# Patient Record
Sex: Male | Born: 1935 | Race: White | Hispanic: No | Marital: Married | Smoking: Never smoker
Health system: Southern US, Community
[De-identification: ages and names within clinical notes are randomized; demographics above are authoritative.]

## PROBLEM LIST (undated history)

## (undated) DIAGNOSIS — F419 Anxiety disorder, unspecified: Secondary | ICD-10-CM

## (undated) DIAGNOSIS — I499 Cardiac arrhythmia, unspecified: Secondary | ICD-10-CM

## (undated) DIAGNOSIS — J8410 Pulmonary fibrosis, unspecified: Secondary | ICD-10-CM

## (undated) HISTORY — PX: EYE SURGERY: SHX253

## (undated) HISTORY — PX: HERNIA REPAIR: SHX51

---

## 2018-01-19 ENCOUNTER — Institutional Professional Consult (permissible substitution): Payer: Self-pay | Admitting: Pulmonary Disease

## 2018-01-26 ENCOUNTER — Ambulatory Visit
Admission: RE | Admit: 2018-01-26 | Discharge: 2018-01-26 | Disposition: A | Discharge status: Home / Self Care | Payer: Self-pay | Source: Ambulatory Visit | Attending: Internal Medicine | Admitting: Internal Medicine

## 2018-01-26 ENCOUNTER — Telehealth: Payer: Self-pay | Admitting: Internal Medicine

## 2018-01-26 ENCOUNTER — Other Ambulatory Visit: Payer: Self-pay | Admitting: *Deleted

## 2018-01-26 ENCOUNTER — Encounter: Payer: Self-pay | Admitting: Internal Medicine

## 2018-01-26 ENCOUNTER — Ambulatory Visit (INDEPENDENT_AMBULATORY_CARE_PROVIDER_SITE_OTHER): Payer: Medicare Other | Admitting: Internal Medicine

## 2018-01-26 ENCOUNTER — Other Ambulatory Visit (INDEPENDENT_AMBULATORY_CARE_PROVIDER_SITE_OTHER): Payer: Medicare Other

## 2018-01-26 VITALS — BP 122/70 | HR 90 | Ht 74.0 in | Wt 217.0 lb

## 2018-01-26 DIAGNOSIS — J849 Interstitial pulmonary disease, unspecified: Secondary | ICD-10-CM

## 2018-01-26 LAB — SEDIMENTATION RATE: Sed Rate: 63 mm/hr — ABNORMAL HIGH (ref 0–20)

## 2018-01-26 NOTE — Telephone Encounter (Signed)
Pt is scheduled to come back to see MR Tues 5/7 at 4pm.  There was an opening at 3pm for pft to be done.  Have scheduled pt to have PFT at 3pm on 5/7. Attempted to call pt to let him know this information and that we would need him to arrive at 3pm instead of 4pm so he could have the PFT done, but pt did not answer.  Left a detailed message for pt stating all the above information and stated in the message for pt to return call so we know he has received the information.  Placed the order for the pft.

## 2018-01-26 NOTE — Patient Instructions (Addendum)
ICD-10-CM   1. ILD (interstitial lung disease) ([facility]) J84.9      -You have Interstitial Lung Disease (ILD)  -  There are > 100 varieties of this - To narrow down possibilities and assess severity please do the following tests  - Serum: ESR, ACE, ANA, DS-DNA, RF, anti-CCP, ssA, ssB, scl-70, ANCA screen, MPO, PR-3, Total CK,  RNP, Aldolase,  Hypersensitivity Pneumonitis Panel   - do  ILD questionnaire  - do Pre-bd spiro and dlco only. No lung volume or bd response. No post-bd spiro next few weks  Followup  - next 1-2 weeks with DR Chase Caller - any slot - prefer 30 minutes in ILD clinic (can consider double book in ILD clinic )

## 2018-01-26 NOTE — Telephone Encounter (Signed)
Pt returned call. Pt confirmed that he is able to make his apt for PFT on 5/7 at 3pm.

## 2018-01-26 NOTE — Progress Notes (Signed)
Subjective:    Patient ID: Omar Mitchell, male    DOB: 12-03-1935, 82 y.o.   MRN: [id number]  PCP Glenda Chroman, MD   HPI   IOV 01/26/2018  Chief Complaint  Patient presents with  . Consult    Referred by Dr. Woody Seller for pulmonary fibrosis.  Pt states he was diagnosed with pulmonary fibrosis in October 2018. Has complaints of cough, some SOB that happens at any time but is worse with exertion.    Referred for evaluation of pulmonary fibrosis to the interstitial lung disease center.  There is a new consult.  He presents with a friend who is well versed with pulmonary fibrosis.  He also presents with his newly married wife.  History is obtained through the pulmonary integrated interstitial lung disease questionnaire  Symptoms: He first developed a cough in November/December 2018.  Cough is present at night it is a dry cough.  Since the cough started it is gotten better.  He does not have any wheezing.  He does have some mild shortness of breath the last 4 to 6 months.  Since it started it is getting better and started somewhat suddenly but he does not room by the conditions in which it started.  He has mild/moderate shortness of breath while walking on a level ground at his own pace and walking on level with others his age.  He has moderate to severe shortness of breath walking up a hill or walking upstairs but he does not have any shortness of breath while eating or standing up from a chair but he does get mild shortness of breath for showering dressing or shopping or sexual activities.  Review of systems: He has some fatigue for the last 6 months he also has arthralgia for the last several years.  He has lost 20 pounds of weight intentionally.  He does not have any snoring or rash or ulcers  Past medical history: Denies any asthma or COPD or heart failure rheumatoid arthritis or scleroderma or lupus or polymyositis or Sjogren's or acid reflux or sleep apnea or pulmonary hypertension or diabetes  or thyroid disease or stroke or seizures or hepatitis or tuberculosis or kidney disease or pneumonia or blood clots or heart disease or pleurisy  Family history of lung disease: Positive for asthma in uncle but no pulmonary fibrosis  Exposure history: Never smoked any cigarettes.  Never smoked any pipes.  He is lived [place] home [place] for the last [place] years.  The home itself is 80 years old.  Occupational history he worked for [employer] for 29 years and retired in 1991.  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  During this time he worked in [employer] floor 5 days a week did not wear a mask.  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  There is significant amount of heart murmur dust that he was exposed to chemical fumes he done some plumbing.  Otherwise he denies other kind of occupational work.  The environment was indeed dusty  Pulmonary toxicity history is taken some prednisone in the past but otherwise pulmonary toxicity history is negative   HRCT 07/05/17 - personally visualized - Read by thoracic rad DrPoff - done [facility] - defnite UIP per Dr Polly Cobia. I concur. There is Co artery calcification and dilated PA    Simple office walk 185 feet x  3 laps goal with forehead probe 01/26/2018   O2 used Room [employer]  Number laps completed All 3 laps  Comments about pace Slow pace  Resting Pulse Ox/HR 98%/105  Final Pulse Ox/HR 92/120  Desaturated </= 88% no  Desaturated <= 3% points yes  Got Tachycardic >/= 90/min yes  Symptoms at end of test Mild dyspnea         PFT data 08/23/17 outside  Fev1 2/35/60%  FVC 2.88/60%  Ratio 82  fef 25-75%   TLC 4.8/61  DLCO 18.26/48%      has no past medical history on file.   has no tobacco history on file.   Allergies  Allergen Reactions  . Neosporin [Neomycin-Polymyxin-Gramicidin] Other (See Comments)    Blisters     Immunization History  Administered Date(s) Administered  .  Influenza, High Dose Seasonal PF 05/29/2017    No family history on file.   Current Outpatient Medications:  .  fexofenadine (ALLEGRA) 180 MG tablet, Take 180 mg by mouth daily., Disp: , Rfl:  .  fluticasone (FLONASE) 50 MCG/ACT nasal spray, Place 2 sprays into both nostrils daily., Disp: , Rfl:  .  VENTOLIN HFA 108 (90 Base) MCG/ACT inhaler, INHALE ONE PUFF INTO THE LUNGS FOUR TIMES DAILY AS NEEDED, Disp: , Rfl: 1    Review of Systems  Constitutional: Positive for unexpected weight change. Negative for fever.  HENT: Positive for congestion and sinus pressure. Negative for dental problem, ear pain, nosebleeds, postnasal drip, rhinorrhea, sneezing, sore throat and trouble swallowing.   Eyes: Negative for redness and itching.  Respiratory: Positive for cough and shortness of breath. Negative for chest tightness and wheezing.   Cardiovascular: Negative for palpitations and leg swelling.  Gastrointestinal: Negative for nausea and vomiting.  Genitourinary: Negative for dysuria.  Musculoskeletal: Positive for joint swelling.  Skin: Negative for rash.  Allergic/Immunologic: Positive for environmental allergies. Negative for food allergies and immunocompromised state.  Neurological: Negative for headaches.  Hematological: Does not bruise/bleed easily.  Psychiatric/Behavioral: Positive for dysphoric mood. The patient is nervous/anxious.        Objective:   Physical Exam  Constitutional: He is oriented to person, place, and time. He appears well-developed and well-nourished. No distress.  HENT:  Head: Normocephalic and atraumatic.  Right Ear: External ear normal.  Left Ear: External ear normal.  Mouth/Throat: Oropharynx is clear and moist. No oropharyngeal exudate.  Eyes: Pupils are equal, round, and reactive to light. Conjunctivae and EOM are normal. Right eye exhibits no discharge. Left eye exhibits no discharge. No scleral icterus.  Neck: Normal range of motion. Neck supple. No JVD  present. No tracheal deviation present. No thyromegaly present.  Cardiovascular: Normal rate, regular rhythm and intact distal pulses. Exam reveals no gallop and no friction rub.  No murmur heard. Pulmonary/Chest: Effort normal. No respiratory distress. He has no wheezes. He has rales. He exhibits no tenderness.  bibalteral bibasal velcro crackes Also front  Abdominal: Soft. Bowel sounds are normal. He exhibits no distension and no mass. There is no tenderness. There is no rebound and no guarding.  Musculoskeletal: Normal range of motion. He exhibits no edema or tenderness.  Clubbing +  Lymphadenopathy:    He has no cervical adenopathy.  Neurological: He is alert and oriented to person, place, and time. He has normal reflexes. No cranial nerve deficit. Coordination normal.  Skin: Skin is warm and dry. No rash noted. He is not diaphoretic. No erythema. No pallor.  Psychiatric: He has a normal mood and affect. His behavior is normal. Judgment and thought content normal.  Nursing note and  vitals reviewed.  Vitals:   01/26/18 1140  BP: 122/70  Pulse: 90  SpO2: 94%  Weight: 217 lb (98.4 kg)  Height: 6' 2"  (1.88 m)    ,Estimated body mass index is 27.86 kg/m as calculated from the following:   Height as of this encounter: 6' 2"  (1.88 m).   Weight as of this encounter: 217 lb (98.4 kg).        Assessment & Plan:     ICD-10-CM   1. ILD (interstitial lung disease) ([facility]) J84.9 Sed Rate (ESR)    Angiotensin converting enzyme    Antinuclear Antib (ANA)    Anti-DNA antibody, double-stranded    Rheumatoid Factor    Cyclic citrul peptide antibody, IgG    Sjogren's syndrome antibods(ssa + ssb)    Anti-scleroderma antibody    ANCA Screen Reflex Titer    Mpo/pr-3 (anca) antibodies    CK Total (and CKMB)    Aldolase    RNP Antibodies    Hypersensitivity pnuemonitis profile       -You have Interstitial Lung Disease (ILD)  -  There are > 100 varieties of this - To narrow down  possibilities and assess severity please do the following tests  - Serum: ESR, ACE, ANA, DS-DNA, RF, anti-CCP, ssA, ssB, scl-70, ANCA screen, MPO, PR-3, Total CK,  RNP, Aldolase,  Hypersensitivity Pneumonitis Panel   - do [facility] ILD questionnaire  - do Pre-bd spiro and dlco only. No lung volume or bd response. No post-bd spiro next few weks  Followup  - next 1-2 weeks with DR Chase Caller - any slot - prefer 30 minutes in ILD clinic (can consider double book in ILD clinic )    Dr. Brand Males, M.D., [facility].C.P Pulmonary and Critical Care Medicine Staff Physician, [place] Director - Interstitial Lung Disease  Program  Pulmonary [place] at [place], [place], [address]  Pager: [phone], If no answer or between  15:00h - 7:00h: call [phone] Telephone: [phone]

## 2018-01-26 NOTE — Telephone Encounter (Signed)
Omar Mitchell needs Pre-bd spiro and dlco only. No lung volume or bd response. No post-bd spiro\ befoe he sees me. Can be here or [facility]

## 2018-01-26 NOTE — Telephone Encounter (Signed)
Will close encounter

## 2018-01-27 LAB — CK TOTAL AND CKMB (NOT AT ARMC)
CK TOTAL: 55 U/L (ref 44–196)
CK, MB: 1.1 ng/mL (ref 0–5.0)
Relative Index: 2 (ref 0–4.0)

## 2018-01-27 LAB — ANTI-DNA ANTIBODY, DOUBLE-STRANDED: ds DNA Ab: <1 IU/mL

## 2018-01-27 LAB — MPO/PR-3 (ANCA) ANTIBODIES: Myeloperoxidase Abs: <1 AI

## 2018-01-27 LAB — CYCLIC CITRUL PEPTIDE ANTIBODY, IGG: Cyclic Citrullin Peptide Ab: <16 UNITS

## 2018-01-27 LAB — ANGIOTENSIN CONVERTING ENZYME: ANGIOTENSIN-CONVERTING ENZYME: 45 U/L (ref 9–67)

## 2018-01-27 LAB — SJOGREN'S SYNDROME ANTIBODS(SSA + SSB)
SSA (Ro) (ENA) Antibody, IgG: <1 AI
SSB (La) (ENA) Antibody, IgG: <1 AI

## 2018-01-27 LAB — RNP ANTIBODIES

## 2018-01-27 LAB — RHEUMATOID FACTOR

## 2018-01-27 LAB — ANTI-SCLERODERMA ANTIBODY: Scleroderma (Scl-70) (ENA) Antibody, IgG: <1 AI

## 2018-01-27 LAB — ANCA SCREEN W REFLEX TITER: ANCA Screen: NEGATIVE

## 2018-01-28 LAB — ALDOLASE: ALDOLASE: 5.3 U/L (ref <=8.1)

## 2018-01-28 LAB — ANA: Anti Nuclear Antibody(ANA): NEGATIVE

## 2018-01-31 LAB — HYPERSENSITIVITY PNUEMONITIS PROFILE
ASPERGILLUS FUMIGATUS: NEGATIVE
FAENIA RETIVIRGULA: NEGATIVE
Pigeon Serum: NEGATIVE
S. VIRIDIS: NEGATIVE
T. CANDIDUS: NEGATIVE
T. VULGARIS: NEGATIVE

## 2018-02-01 ENCOUNTER — Ambulatory Visit (INDEPENDENT_AMBULATORY_CARE_PROVIDER_SITE_OTHER): Payer: Medicare Other | Admitting: Internal Medicine

## 2018-02-01 ENCOUNTER — Encounter: Payer: Self-pay | Admitting: Internal Medicine

## 2018-02-01 VITALS — BP 122/72 | HR 81 | Ht 72.5 in | Wt 221.4 lb

## 2018-02-01 DIAGNOSIS — J84112 Idiopathic pulmonary fibrosis: Secondary | ICD-10-CM

## 2018-02-01 DIAGNOSIS — Z575 Occupational exposure to toxic agents in other industries: Secondary | ICD-10-CM | POA: Diagnosis not present

## 2018-02-01 DIAGNOSIS — J849 Interstitial pulmonary disease, unspecified: Secondary | ICD-10-CM

## 2018-02-01 LAB — PULMONARY FUNCTION TEST
DL/VA % pred: 84 %
DL/VA: 4.01 ml/min/mmHg/L
DLCO UNC: 14.41 ml/min/mmHg
DLCO unc % pred: 40 %
FEF 25-75 PRE: 2.61 L/s
FEF2575-%Pred-Pre: 117 %
FEV1-%Pred-Pre: 59 %
FEV1-PRE: 1.88 L
FEV1FVC-%Pred-Pre: 118 %
FEV6-%PRED-PRE: 52 %
FEV6-Pre: 2.19 L
FEV6FVC-%Pred-Pre: 105 %
FVC-%PRED-PRE: 49 %
FVC-Pre: 2.2 L
PRE FEV1/FVC RATIO: 85 %
PRE FEV6/FVC RATIO: 100 %

## 2018-02-01 NOTE — Progress Notes (Signed)
PFT completed today.  

## 2018-02-01 NOTE — Patient Instructions (Addendum)
ICD-10-CM   1. IPF (idiopathic pulmonary fibrosis) (HCC) J84.112   2. ILD (interstitial lung disease) (HCC) J84.9   3. Occupational exposure to industrial toxins Z57.5    Start nintedanib  Follow-up -4-6 weeks for encounter for therapeutic monitoring

## 2018-02-01 NOTE — Progress Notes (Signed)
Subjective:     Patient ID: Omar Mitchell, male   DOB: Jun 04, 1936, 82 y.o.   MRN: [id number]  HPI  IOV 01/26/2018  Chief Complaint  Patient presents with  . Consult    Referred by Dr. Sherril Croon for pulmonary fibrosis.  Pt states he was diagnosed with pulmonary fibrosis in October 2018. Has complaints of cough, some SOB that happens at any time but is worse with exertion.    Referred for evaluation of pulmonary fibrosis to the interstitial lung disease center.  There is a new consult.  He presents with a friend who is well versed with pulmonary fibrosis.  He also presents with his newly married wife.  History is obtained through the pulmonary integrated interstitial lung disease questionnaire  Symptoms: He first developed a cough in November/December 2018.  Cough is present at night it is a dry cough.  Since the cough started it is gotten better.  He does not have any wheezing.  He does have some mild shortness of breath the last 4 to 6 months.  Since it started it is getting better and started somewhat suddenly but he does not room by the conditions in which it started.  He has mild/moderate shortness of breath while walking on a level ground at his own pace and walking on level with others his age.  He has moderate to severe shortness of breath walking up a hill or walking upstairs but he does not have any shortness of breath while eating or standing up from a chair but he does get mild shortness of breath for showering dressing or shopping or sexual activities.  Review of systems: He has some fatigue for the last 6 months he also has arthralgia for the last several years.  He has lost 20 pounds of weight intentionally.  He does not have any snoring or rash or ulcers  Past medical history: Denies any asthma or COPD or heart failure rheumatoid arthritis or scleroderma or lupus or polymyositis or Sjogren's or acid reflux or sleep apnea or pulmonary hypertension or diabetes or thyroid disease or stroke  or seizures or hepatitis or tuberculosis or kidney disease or pneumonia or blood clots or heart disease or pleurisy  Family history of lung disease: Positive for asthma in uncle but no pulmonary fibrosis  Exposure history: Never smoked any cigarettes.  Never smoked any pipes.  He is lived [place] home [place] the last [place] years.  The home itself is 80 years old.  Occupational history he worked for [employer] for 29 years and retired in 1991.  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  During this time he worked in [employer] floor 5 days a week did not wear a mask.  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  There is significant amount of heart murmur dust that he was exposed to chemical fumes he done some plumbing.  Otherwise he denies other kind of occupational work.  The environment was indeed dusty. But he never cut nylon fibers. They made nylon here. So the fumes he says he was exposed to was "monomer" and was hot chemical fumes  Pulmonary toxicity history is taken some prednisone in the past but otherwise pulmonary toxicity history is negative    HRCT 07/05/17 - personally visualized - Read by thoracic rad DrPoff - done [facility] - defnite UIP per Dr Ashley Murrain. I concur. There is Co artery calcification and dilated PA    Simple office  walk 185 feet x  3 laps goal with forehead probe 01/26/2018   O2 used Room [employer]  Number laps completed All 3 laps  Comments about pace Slow pace  Resting Pulse Ox/HR 98%/105  Final Pulse Ox/HR 92/120  Desaturated </= 88% no  Desaturated <= 3% points yes  Got Tachycardic >/= 90/min yes  Symptoms at end of test Mild dyspnea         has no past medical history on file.   reports that he has never smoked. He has never used smokeless tobacco.  No past surgical history on file.  Allergies  Allergen Reactions  . Neosporin [Neomycin-Polymyxin-Gramicidin] Other (See Comments)    Blisters      Immunization History  Administered Date(s) Administered  . Influenza, High Dose Seasonal PF 05/29/2017    No family history on file.   Current Outpatient Medications:  .  fexofenadine (ALLEGRA) 180 MG tablet, Take 180 mg by mouth daily., Disp: , Rfl:  .  fluticasone (FLONASE) 50 MCG/ACT nasal spray, Place 2 sprays into both nostrils daily., Disp: , Rfl:  .  VENTOLIN HFA 108 (90 Base) MCG/ACT inhaler, INHALE ONE PUFF INTO THE LUNGS FOUR TIMES DAILY AS NEEDED, Disp: , Rfl: 1   OV 02/01/2018  Chief Complaint  Patient presents with  . Follow-up    PFT done today. Pt states he is about the same as last visit and may even be some better.    Omar Mitchell is here for follow-up to discuss his test results as part of his interstitial lung disease work-up.  He is here with his wife and family friend who is quite up-to-date in knowledge about pulmonary fibrosis.  Subjective: Since his last visit 1 week ago there are no new issues.  In review of his interstitial lung disease questionnaire the thing that stood out was his occupational history.  I went over this again and again he confirmed the same findings as above which is that he was working at [employer] for most of his career for many decades.  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  He was exposed with a chemical fumes and the doors were open.  As best as I can gather it was a hot environment.  They were not wearing masks.  In addition post employment apparently the company was checking lung function on him and the others.  He recollects that his lung function was considered normal.  I am not fully sure when his last lung function done by the employer was where he was clearly several years or may be even a decade ago.  Based on this it was obvious that his level of exposure was significant and way higher than the general population of human beings   Objective: His autoimmune  hypersensitivity pneumonitis panel are negative as documented below    Results for EVENS, MENO (MRN [id number]) as of 02/01/2018 15:59  Ref. Range 01/26/2018 13:00  ASPERGILLUS FUMIGATUS Latest Ref Range: NEGATIVE  NEGATIVE  [place]on Serum Latest Ref Range: NEGATIVE  NEGATIVE  Anit Nuclear Antibody(ANA) Latest Ref Range: NEGATIVE  NEGATIVE  Angiotensin-Converting Enzyme Latest Ref Range: 9 - 67 U/L 45  Cyclic Citrullin Peptide Ab Latest Units: UNITS <16  ds DNA Ab Latest Units: IU/mL <1  ENA RNP Ab Latest Ref Range: 0.0 - 0.9 AI <0.2  Myeloperoxidase Abs Latest Units: AI <1.0  Serine Protease 3 Latest Units: AI <1.0  RA Latex Turbid. Latest  Ref Range: <14 IU/mL <14  SSA (Ro) (ENA) Antibody, IgG Latest Ref Range: <1.0 NEG AI <1.0 NEG  SSB (La) (ENA) Antibody, IgG Latest Ref Range: <1.0 NEG AI <1.0 NEG  Scleroderma (Scl-70) (ENA) Antibody, IgG Latest Ref Range: <1.0 NEG AI <1.0 NEG     Severity data: His pulmonary function test currently is at moderate severity but also shows a significant decline compared to November 2018 although subjectively he has not felt it  PFT data 08/23/17 outside 02/01/2018   Fev1 2.35/60% 1.88/59%  FVC 2.88/60% 2.2/49%  Ratio 82 85/118%  fef 25-75%    TLC 4.8/61   DLCO 18.26/48% 14.4/40%     Review of Systems     Objective:   Physical Exam Vitals:   02/01/18 1522  BP: 122/72  Pulse: 81  SpO2: 95%  Weight: 221 lb 6.4 oz (100.4 kg)  Height: 6' 0.5" (1.842 m)   Estimated body mass index is 29.61 kg/m as calculated from the following:   Height as of this encounter: 6' 0.5" (1.842 m).   Weight as of this encounter: 221 lb 6.4 oz (100.4 kg).   Discussion only visit    Assessment:       ICD-10-CM   1. IPF (idiopathic pulmonary fibrosis) (HCC) J84.112   2. ILD (interstitial lung disease) (HCC) J84.9   3. Occupational exposure to industrial toxins Z57.5     I think a specific variety of interstitial lung disease that Omar Mitchell and  has his idiopathic pulmonary fibrosis.  This is based on the fact that he is a male gender, age greater than 57, negative autoimmune profile and classic CT scan of the chest with UIP and progressive nature.  Having said that IPF does have risk factors.  The most common ones being acid reflux disease and also prior cigarette smoking neither of which exist in this gentleman.  The significant risk factor that I can determine is the occupation exposure to chemical fumes in the process of manufacturing nylon.  Based on the history it appears the employer was concerned about this.  It is very classic in  pulmonary fibrosis that there is a long latency of many decades before the development of pulmonary fibrosis.  Therefore I believe that this was a significant risk factor in his development of IPF.  Classic nylon related pulmonary fibrosis has been described but this is a hypersensitivity pneumonitis related to nylon cutting.  In the situation Omar Mitchell was not cutting nylon fibers and a CT scan features are not consistent with hypersensitivity pneumonitis and therefore he shuold qualify for IPF antifibrotic Rx     Plan:     Had an extensive discussion with Omar Mitchell and his family.  Explained the prognosis of few to several years.  Including the fact that he has progressed.  We discussed the role of anti-fibrotic's extensively today especially the 2 approved drugs nintedanib and pirfenidone.  He prefers nintedanib because he prefers a drug that is 2 times daily and he prefers diarrhea as a side effect over nausea, vomiting and anorexia and the fact with pirfenidone he will have to apply sunscreen which he does not want to.  Is not known to have any coronary artery disease or bleeding diathesis or stomach ulcers or known liver disease and therefore I think nintedanib is a good first-line agent.  I have advised him that he will need regular monitoring of liver function tests and close follow-up for side effects.   If he fails  nintedanib then we will switch him to pirfenidone.  Explained the goal of treatment is not to help his symptoms but to prevent progression with a number needed to treat around 1: 6  Other pillars of treatment include  6 pillars of ILD /IPF / Pulmonary Fibrosis Managment to be discussed over time in future  - rehab  - nutrition weight loss  - support groups  - research trials  - transplant (will not qualify due to age and weight)  - GERD silent Rx     > 50% of this > 40 min visit spent in face to face counseling or/and coordination of care     Dr. Kalman Shan, M.D., [facility].C.P Pulmonary and Critical Care Medicine Staff Physician, [facility] Director - Interstitial Lung Disease  Program  Pulmonary Fibrosis [facility] at [facility] [place], [place], [address]  Pager: [phone], If no answer or between  15:00h - 7:00h: call [phone] Telephone: [phone]

## 2018-02-03 ENCOUNTER — Telehealth: Payer: Self-pay | Admitting: Internal Medicine

## 2018-02-03 ENCOUNTER — Encounter: Payer: Self-pay | Admitting: Internal Medicine

## 2018-02-03 NOTE — Telephone Encounter (Signed)
Patient is wanting to bring paperwork for [facility].  States this isn't something he wants to just drop off, that he needs to see Irving Burton as well. He is wanting to leave now but I have advised him she is with patients today and I do not know if she will be able to see him.  He asked to be called back asap.  CB is [phone]

## 2018-02-03 NOTE — Telephone Encounter (Signed)
Omar Mitchell. Dx is IPF> Let patient know that and start Nintedanib. ROV 4-6 weeks   Dr. Kalman Shan, M.D., [facility].C.P Pulmonary and Critical Care Medicine Staff Physician, [facility] Director - Interstitial Lung Disease  Program  Pulmonary Fibrosis [facility] at [facility] [place], [place], [address]  Pager: [phone], If no answer or between  15:00h - 7:00h: call [phone] Telephone: [phone]

## 2018-02-03 NOTE — Telephone Encounter (Signed)
Pt is calling back. Cb is [phone]

## 2018-02-03 NOTE — Telephone Encounter (Signed)
Called and spoke with pt's wife who stated pt has left the house and is heading to the office to bring the paperwork for me to see what I need to make copies of.  Stated to his wife I would tell front desk people to find me once he arrives so that I can come out to the lobby to get the paperwork from him.

## 2018-02-03 NOTE — Telephone Encounter (Signed)
Called and spoke with pt letting him know the dx and that we were going to start him on OFEV.  Pt expressed understanding. I stated to pt as soon as he was able to bring me W2 and 2018 Tax information, that would be great so that way I could send that information with the paperwork.  Pt stated to me he would try to bring the paperwork as soon as he could. Will keep encounter open

## 2018-02-09 NOTE — Telephone Encounter (Signed)
Received a form from [employer] that needs to be filled out and signed by both MR and pt for enrollment for OFEV.  Called and spoke with pt stating we needed a signature on the form from him on the form so I can fax it back to [employer].  Pt stated he would try to come by office tomorrow, 5/16 or Friday, 5/17 to sign the form.  Told pt to ask for me when he came by the office so I could bring the form out for him to sign.

## 2018-02-10 NOTE — Telephone Encounter (Signed)
Zee from [employer] needs OFEV RX faxed to them or portion on the form to be completed.  Fax [phone] CB [phone]

## 2018-02-11 NOTE — Telephone Encounter (Signed)
PA initiated via covermymeds for pt's OFEV.  Med was approved through 09/27/18.  Called [facility] due to some information being left off of a form and spoke with Brandi who stated the dosage of OFEV, directions, quantity, and day supply was left blank.  Filled out the needed information and refaxed the information to [facility].

## 2018-02-11 NOTE — Telephone Encounter (Signed)
Called [facility] and was transferred to [facility] who is over the PA dept.  I stated to her a PA was initiated for pt's OFEV and pt has been approved beginning today through 09/27/18.  Reference #: [id number].  Eustaquio Maize stated to me she would get working on this for pt so that way they can schedule shipment of pt's med.

## 2018-02-11 NOTE — Telephone Encounter (Signed)
Spoke with lam at [facility] and he states this medication needs a PA. Irving Burton are we just going with [facility] or I need to do the PA now? Please advise.

## 2018-02-11 NOTE — Telephone Encounter (Signed)
Lelon Mast [facility] is calling for the OFEV [phone]

## 2018-02-14 ENCOUNTER — Telehealth: Payer: Self-pay | Admitting: Internal Medicine

## 2018-02-14 NOTE — Telephone Encounter (Signed)
Called and spoke with pt who stated he received a call from Lisbeth Ply from [employer] who stated pt's copay for OFEV was $2276 at least for the first monthly payment.  Pt stated Lisbeth Ply stated to him to call [employer] and also gave him another name of [employer] for another company to call.  Pt wanted to hear from me if these places were legitimate places to call before he called them per Lisbeth Ply from [employer]  I stated to pt to call [employer] as this company will see if they can be able to help pt out with payments for the OFEV.  Stated to pt after he calls [employer], if they are able to help him out with payments, he then will need to call [facility] as they are waiting on his final say to schedule a delivery of the OFEV.  Stated to pt to call us back if he needed anything else.  Pt expressed understanding and stated he would be calling [facility] and then will call [facility] back.

## 2018-02-17 ENCOUNTER — Telehealth: Payer: Self-pay | Admitting: Internal Medicine

## 2018-02-17 NOTE — Telephone Encounter (Signed)
Spoke with pt. States that he has taken his first round of OFEV. He denies any side effects at this time. Nothing further was needed.

## 2018-02-24 ENCOUNTER — Telehealth: Payer: Self-pay | Admitting: Internal Medicine

## 2018-02-25 MED ORDER — PREDNISONE 10 MG PO TABS: ORAL_TABLET | ORAL | 0 refills | Status: DC

## 2018-02-25 NOTE — Telephone Encounter (Signed)
Recommend he try  Please take prednisone 40 mg x1 day, then 30 mg x1 day, then 20 mg x1 day, then 10 mg x1 day, and then 5 mg x1 day and stop  And   Yes stay away from heat

## 2018-02-25 NOTE — Telephone Encounter (Signed)
Per MR- Prednisone 40mg  x1 day, 30mg  x1 day, 20mg  x 1day, 10mg  x 1 day, 5mg  x 1day, and stay out of the heat. Called and spoke with Patient about recommendations. Patient stated understanding.  Prescription sent to [facility] Per Patient's request.  Nothing further needed at this time.

## 2018-02-25 NOTE — Telephone Encounter (Signed)
Called and spoke with Patient. [facility].  Appointment scheduled for 12/07/2017 at 2pm for Ofev follow up and ILD clinic.He wants MR to know that he has a cough with clear mucus that want go away.  He was outside yesterday and got hot. He wanted to know if that may have made it worse. I explained that the heat can make breathing issues worse and can make it hard to breathe.  He wanted to know if MR would recommend him a OTC cough syrup or call him something in.  He has been using cough drops and its not working.  MR please advise

## 2018-03-09 ENCOUNTER — Telehealth: Payer: Self-pay | Admitting: Internal Medicine

## 2018-03-09 NOTE — Telephone Encounter (Signed)
Called and spoke with pt's spouse Valentina GuLucy, who stated they have an opportunity to move to an assisted living arrangement and she wanted to know if it would be better for both her and pt to move to an assisted living facility now since they have the opportunity.  Valentina GuLucy stated that there is a lady that could come to their house to help out with various things that is needed but Valentina GuLucy stated that this person is not a nurse and that is why she wanted to know if it would be best for her and pt especially to go ahead and move into an assisted living facility knowing one is available and also with their health and with their age.  MR, please advise on this for both pt and Lucy.  Thanks!

## 2018-03-09 NOTE — Telephone Encounter (Signed)
Called to speak to patients wife, she requested to speak only to MR or Irving Burtonmily. Will forward to emily.

## 2018-03-10 ENCOUNTER — Telehealth: Payer: Self-pay | Admitting: Internal Medicine

## 2018-03-10 NOTE — Telephone Encounter (Signed)
Called and spoke with patients wife, advised her of MR response in previous message. Advised her that it would be good for them both to go. Nothing further needed. Patients wife verbalized understanding.

## 2018-03-10 NOTE — Telephone Encounter (Signed)
Good idea. 

## 2018-03-10 NOTE — Telephone Encounter (Signed)
Advised both patient and patients wife of MR response. Nothing further needed.

## 2018-03-11 ENCOUNTER — Telehealth: Payer: Self-pay | Admitting: Internal Medicine

## 2018-03-11 NOTE — Telephone Encounter (Signed)
Attempted to call patient today regarding pt's co-pay concerns. I did not receive an answer at time of call. I have left a voicemail message for pt to return call. X1

## 2018-03-14 NOTE — Telephone Encounter (Signed)
Attempted to call pt. I did not receive an answer. I have left a message for pt to return our call.  

## 2018-03-16 NOTE — Telephone Encounter (Signed)
lmtcb X3 for pt.  Will close encounter per triage protocol.  

## 2018-03-17 ENCOUNTER — Telehealth: Payer: Self-pay | Admitting: Internal Medicine

## 2018-03-17 NOTE — Telephone Encounter (Signed)
Called and spoke with pt's spouse Valentina GuLucy who states pt wants MR to call them directly to discuss going to assisted living facility.  Stated to Valentina GuLucy I would send the message to MR so he can contact pt.  Lucy expressed understanding.  MR, please see message of pt wanting you to contact him at your earliest convenience. Pt can be reached at [phone].  Thanks!

## 2018-03-21 NOTE — Telephone Encounter (Signed)
Long discussion -> with patient. Wants me to decide about assisted living . Advised if support is great for his quality of life at assisted lviing and coverage is good for few to several years and he is not emotonally attached to his house he sholuld go to assisted living

## 2018-03-21 NOTE — Telephone Encounter (Signed)
Called and spoke with patient today requesting to speak with MR regards to assisted living concerns. Advised pt that I will deliver the message to MR and his nurse directly. Spoke with MR and Irving Burtonmily this morning letting them aware pt is needing to speak with MR today. MR advised he will call the pt back this morning.   Routing message to MR for review.

## 2018-03-24 ENCOUNTER — Encounter: Payer: Self-pay | Admitting: Internal Medicine

## 2018-03-24 ENCOUNTER — Inpatient Hospital Stay (HOSPITAL_COMMUNITY)
Admission: AD | Admit: 2018-03-24 | Discharge: 2018-04-28 | DRG: 196 | Disposition: E | Payer: Medicare Other | Source: Ambulatory Visit | Attending: Pulmonary Disease | Admitting: Pulmonary Disease

## 2018-03-24 ENCOUNTER — Encounter (HOSPITAL_COMMUNITY): Payer: Self-pay | Admitting: *Deleted

## 2018-03-24 ENCOUNTER — Ambulatory Visit (INDEPENDENT_AMBULATORY_CARE_PROVIDER_SITE_OTHER): Payer: Medicare Other | Admitting: Internal Medicine

## 2018-03-24 VITALS — BP 110/72 | HR 116 | Ht 72.0 in | Wt 206.0 lb

## 2018-03-24 DIAGNOSIS — K219 Gastro-esophageal reflux disease without esophagitis: Secondary | ICD-10-CM | POA: Diagnosis present

## 2018-03-24 DIAGNOSIS — Z9289 Personal history of other medical treatment: Secondary | ICD-10-CM

## 2018-03-24 DIAGNOSIS — J9601 Acute respiratory failure with hypoxia: Secondary | ICD-10-CM

## 2018-03-24 DIAGNOSIS — Z09 Encounter for follow-up examination after completed treatment for conditions other than malignant neoplasm: Secondary | ICD-10-CM

## 2018-03-24 DIAGNOSIS — J84112 Idiopathic pulmonary fibrosis: Secondary | ICD-10-CM | POA: Diagnosis not present

## 2018-03-24 DIAGNOSIS — I2510 Atherosclerotic heart disease of native coronary artery without angina pectoris: Secondary | ICD-10-CM | POA: Diagnosis present

## 2018-03-24 DIAGNOSIS — R739 Hyperglycemia, unspecified: Secondary | ICD-10-CM | POA: Diagnosis not present

## 2018-03-24 DIAGNOSIS — J9621 Acute and chronic respiratory failure with hypoxia: Secondary | ICD-10-CM

## 2018-03-24 DIAGNOSIS — Z66 Do not resuscitate: Secondary | ICD-10-CM | POA: Diagnosis not present

## 2018-03-24 DIAGNOSIS — J309 Allergic rhinitis, unspecified: Secondary | ICD-10-CM | POA: Diagnosis not present

## 2018-03-24 DIAGNOSIS — Z515 Encounter for palliative care: Secondary | ICD-10-CM | POA: Diagnosis not present

## 2018-03-24 DIAGNOSIS — Y92230 Patient room in hospital as the place of occurrence of the external cause: Secondary | ICD-10-CM | POA: Diagnosis not present

## 2018-03-24 DIAGNOSIS — I481 Persistent atrial fibrillation: Secondary | ICD-10-CM | POA: Diagnosis present

## 2018-03-24 DIAGNOSIS — J9620 Acute and chronic respiratory failure, unspecified whether with hypoxia or hypercapnia: Secondary | ICD-10-CM | POA: Diagnosis present

## 2018-03-24 DIAGNOSIS — Z7951 Long term (current) use of inhaled steroids: Secondary | ICD-10-CM | POA: Diagnosis not present

## 2018-03-24 DIAGNOSIS — R451 Restlessness and agitation: Secondary | ICD-10-CM | POA: Diagnosis not present

## 2018-03-24 DIAGNOSIS — Z8249 Family history of ischemic heart disease and other diseases of the circulatory system: Secondary | ICD-10-CM | POA: Diagnosis not present

## 2018-03-24 DIAGNOSIS — I5021 Acute systolic (congestive) heart failure: Secondary | ICD-10-CM | POA: Diagnosis present

## 2018-03-24 DIAGNOSIS — Z881 Allergy status to other antibiotic agents status: Secondary | ICD-10-CM

## 2018-03-24 DIAGNOSIS — F419 Anxiety disorder, unspecified: Secondary | ICD-10-CM | POA: Diagnosis present

## 2018-03-24 DIAGNOSIS — J9600 Acute respiratory failure, unspecified whether with hypoxia or hypercapnia: Secondary | ICD-10-CM | POA: Diagnosis present

## 2018-03-24 DIAGNOSIS — R410 Disorientation, unspecified: Secondary | ICD-10-CM | POA: Diagnosis not present

## 2018-03-24 DIAGNOSIS — T380X5A Adverse effect of glucocorticoids and synthetic analogues, initial encounter: Secondary | ICD-10-CM | POA: Diagnosis not present

## 2018-03-24 DIAGNOSIS — I4891 Unspecified atrial fibrillation: Secondary | ICD-10-CM | POA: Diagnosis not present

## 2018-03-24 DIAGNOSIS — T424X5A Adverse effect of benzodiazepines, initial encounter: Secondary | ICD-10-CM | POA: Diagnosis not present

## 2018-03-24 DIAGNOSIS — E872 Acidosis: Secondary | ICD-10-CM | POA: Diagnosis not present

## 2018-03-24 DIAGNOSIS — J9622 Acute and chronic respiratory failure with hypercapnia: Secondary | ICD-10-CM | POA: Diagnosis not present

## 2018-03-24 DIAGNOSIS — J810 Acute pulmonary edema: Secondary | ICD-10-CM | POA: Diagnosis not present

## 2018-03-24 DIAGNOSIS — J849 Interstitial pulmonary disease, unspecified: Secondary | ICD-10-CM | POA: Diagnosis not present

## 2018-03-24 DIAGNOSIS — J9690 Respiratory failure, unspecified, unspecified whether with hypoxia or hypercapnia: Secondary | ICD-10-CM

## 2018-03-24 DIAGNOSIS — I700 Atherosclerosis of aorta: Secondary | ICD-10-CM | POA: Diagnosis present

## 2018-03-24 DIAGNOSIS — I340 Nonrheumatic mitral (valve) insufficiency: Secondary | ICD-10-CM | POA: Diagnosis not present

## 2018-03-24 DIAGNOSIS — R0600 Dyspnea, unspecified: Secondary | ICD-10-CM

## 2018-03-24 HISTORY — DX: Cardiac arrhythmia, unspecified: I49.9

## 2018-03-24 HISTORY — DX: Anxiety disorder, unspecified: F41.9

## 2018-03-24 HISTORY — DX: Pulmonary fibrosis, unspecified: J84.10

## 2018-03-24 LAB — PROCALCITONIN: Procalcitonin: <0.1 ng/mL

## 2018-03-24 LAB — CBC WITH DIFFERENTIAL/PLATELET
Basophils Absolute: 0 10*3/uL (ref 0.0–0.1)
Basophils Relative: 0 %
Eosinophils Absolute: 0 10*3/uL (ref 0.0–0.7)
Eosinophils Relative: 0 %
HCT: 42.4 % (ref 39.0–52.0)
Hemoglobin: 14 g/dL (ref 13.0–17.0)
Lymphocytes Relative: 8 %
Lymphs Abs: 1 10*3/uL (ref 0.7–4.0)
MCH: 32 pg (ref 26.0–34.0)
MCHC: 33 g/dL (ref 30.0–36.0)
MCV: 97 fL (ref 78.0–100.0)
Monocytes Absolute: 1.1 10*3/uL — ABNORMAL HIGH (ref 0.1–1.0)
Monocytes Relative: 10 %
Neutro Abs: 9.5 10*3/uL — ABNORMAL HIGH (ref 1.7–7.7)
Neutrophils Relative %: 82 %
Platelets: 258 10*3/uL (ref 150–400)
RBC: 4.37 MIL/uL (ref 4.22–5.81)
RDW: 14.6 % (ref 11.5–15.5)
WBC: 11.6 10*3/uL — ABNORMAL HIGH (ref 4.0–10.5)

## 2018-03-24 LAB — AMYLASE: Amylase: 44 U/L (ref 28–100)

## 2018-03-24 LAB — MAGNESIUM: Magnesium: 1.9 mg/dL (ref 1.7–2.4)

## 2018-03-24 LAB — COMPREHENSIVE METABOLIC PANEL
ALBUMIN: 2.8 g/dL — AB (ref 3.5–5.0)
ALT: 22 U/L (ref 0–44)
AST: 28 U/L (ref 15–41)
Alkaline Phosphatase: 90 U/L (ref 38–126)
Anion gap: 11 (ref 5–15)
BILIRUBIN TOTAL: 1.2 mg/dL (ref 0.3–1.2)
BUN: 15 mg/dL (ref 8–23)
CALCIUM: 8.4 mg/dL — AB (ref 8.9–10.3)
CO2: 28 mmol/L (ref 22–32)
CREATININE: 0.84 mg/dL (ref 0.61–1.24)
Chloride: 97 mmol/L — ABNORMAL LOW (ref 98–111)
GFR calc Af Amer: >60 mL/min (ref >60)
GLUCOSE: 131 mg/dL — AB (ref 70–99)
Potassium: 4.1 mmol/L (ref 3.5–5.1)
Sodium: 136 mmol/L (ref 135–145)
TOTAL PROTEIN: 7.1 g/dL (ref 6.5–8.1)

## 2018-03-24 LAB — LACTIC ACID, PLASMA
LACTIC ACID, VENOUS: 2.6 mmol/L — AB (ref 0.5–1.9)
LACTIC ACID, VENOUS: 2.4 mmol/L — AB (ref 0.5–1.9)

## 2018-03-24 LAB — TROPONIN I
Troponin I: <0.03 ng/mL (ref <0.03)
Troponin I: <0.03 ng/mL

## 2018-03-24 LAB — MRSA PCR SCREENING: MRSA BY PCR: NEGATIVE

## 2018-03-24 LAB — BRAIN NATRIURETIC PEPTIDE: B Natriuretic Peptide: 445.4 pg/mL — ABNORMAL HIGH (ref 0.0–100.0)

## 2018-03-24 LAB — LIPASE, BLOOD: Lipase: 26 U/L (ref 11–51)

## 2018-03-24 LAB — PHOSPHORUS: Phosphorus: 2.6 mg/dL (ref 2.5–4.6)

## 2018-03-24 MED ORDER — GUAIFENESIN-DM 100-10 MG/5ML PO SYRP
5.0000 mL | ORAL_SOLUTION | ORAL | Status: DC | PRN
  Administered 2018-03-25 – 2018-03-28 (×7): 5 mL via ORAL
  Filled 2018-03-24 (×7): qty 10

## 2018-03-24 MED ORDER — DILTIAZEM HCL-DEXTROSE 100-5 MG/100ML-% IV SOLN (PREMIX)
5.0000 mg/h | INTRAVENOUS | Status: DC
  Administered 2018-03-24: 5 mg/h via INTRAVENOUS
  Administered 2018-03-25 (×2): 15 mg/h via INTRAVENOUS
  Administered 2018-03-26: 5 mg/h via INTRAVENOUS
  Filled 2018-03-24 (×8): qty 100

## 2018-03-24 MED ORDER — IPRATROPIUM BROMIDE 0.02 % IN SOLN
0.5000 mg | RESPIRATORY_TRACT | Status: DC
  Administered 2018-03-24 – 2018-03-25 (×4): 0.5 mg via RESPIRATORY_TRACT
  Filled 2018-03-24 (×4): qty 2.5

## 2018-03-24 MED ORDER — FAMOTIDINE 20 MG PO TABS
20.0000 mg | ORAL_TABLET | Freq: Two times a day (BID) | ORAL | Status: DC
Start: 2018-03-24 — End: 2018-03-28
  Administered 2018-03-24 – 2018-03-27 (×7): 20 mg via ORAL
  Filled 2018-03-24 (×7): qty 1

## 2018-03-24 MED ORDER — LEVALBUTEROL HCL 0.63 MG/3ML IN NEBU
0.6300 mg | INHALATION_SOLUTION | RESPIRATORY_TRACT | Status: DC
  Administered 2018-03-24 – 2018-03-25 (×4): 0.63 mg via RESPIRATORY_TRACT
  Filled 2018-03-24 (×4): qty 3

## 2018-03-24 MED ORDER — HEPARIN SODIUM (PORCINE) 5000 UNIT/ML IJ SOLN
5000.0000 [IU] | Freq: Three times a day (TID) | INTRAMUSCULAR | Status: DC
  Administered 2018-03-24 – 2018-03-25 (×2): 5000 [IU] via SUBCUTANEOUS
  Filled 2018-03-24 (×2): qty 1

## 2018-03-24 MED ORDER — DILTIAZEM LOAD VIA INFUSION
10.0000 mg | Freq: Once | INTRAVENOUS | Status: AC
  Administered 2018-03-24: 10 mg via INTRAVENOUS
  Filled 2018-03-24: qty 10

## 2018-03-24 MED ORDER — SODIUM CHLORIDE 0.9 % IV SOLN
250.0000 mL | INTRAVENOUS | Status: DC | PRN
  Administered 2018-03-24 – 2018-03-25 (×2): 250 mL via INTRAVENOUS

## 2018-03-24 MED ORDER — METHYLPREDNISOLONE SODIUM SUCC 125 MG IJ SOLR
80.0000 mg | Freq: Four times a day (QID) | INTRAMUSCULAR | Status: DC
  Administered 2018-03-24 – 2018-03-28 (×15): 80 mg via INTRAVENOUS
  Filled 2018-03-24 (×15): qty 2

## 2018-03-24 MED ORDER — ALPRAZOLAM 0.25 MG PO TABS
0.2500 mg | ORAL_TABLET | Freq: Two times a day (BID) | ORAL | Status: DC | PRN
  Administered 2018-03-24 – 2018-03-29 (×7): 0.25 mg via ORAL
  Filled 2018-03-24 (×7): qty 1

## 2018-03-24 NOTE — Progress Notes (Signed)
Subjective:     Patient ID: Omar Mitchell, male   DOB: 1935/10/21, 82 y.o.   MRN: [id number]    HPI  IOV 01/26/2018  Chief Complaint  Patient presents with  . Consult    Referred by Dr. Sherril Croon for pulmonary fibrosis.  Pt states he was diagnosed with pulmonary fibrosis in October 2018. Has complaints of cough, some SOB that happens at any time but is worse with exertion.    Referred for evaluation of pulmonary fibrosis to the interstitial lung disease center.  There is a new consult.  He presents with a friend who is well versed with pulmonary fibrosis.  He also presents with his newly married wife.  History is obtained through the pulmonary integrated interstitial lung disease questionnaire  Symptoms: He first developed a cough in November/December 2018.  Cough is present at night it is a dry cough.  Since the cough started it is gotten better.  He does not have any wheezing.  He does have some mild shortness of breath the last 4 to 6 months.  Since it started it is getting better and started somewhat suddenly but he does not room by the conditions in which it started.  He has mild/moderate shortness of breath while walking on a level ground at his own pace and walking on level with others his age.  He has moderate to severe shortness of breath walking up a hill or walking upstairs but he does not have any shortness of breath while eating or standing up from a chair but he does get mild shortness of breath for showering dressing or shopping or sexual activities.  Review of systems: He has some fatigue for the last 6 months he also has arthralgia for the last several years.  He has lost 20 pounds of weight intentionally.  He does not have any snoring or rash or ulcers  Past medical history: Denies any asthma or COPD or heart failure rheumatoid arthritis or scleroderma or lupus or polymyositis or Sjogren's or acid reflux or sleep apnea or pulmonary hypertension or diabetes or thyroid disease or  stroke or seizures or hepatitis or tuberculosis or kidney disease or pneumonia or blood clots or heart disease or pleurisy  Family history of lung disease: Positive for asthma in uncle but no pulmonary fibrosis  Exposure history: Never smoked any cigarettes.  Never smoked any pipes.  He is lived [place] the last [place] years.  The home itself is 80 years old.  Occupational history he worked for [employer] for 29 years and retired in 1991.  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  During this time he worked in [employer] floor 5 days a week did not wear a mask.  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  There is significant amount of heart murmur dust that he was exposed to chemical fumes he done some plumbing.  Otherwise he denies other kind of occupational work.  The environment was indeed dusty. But he never cut nylon fibers. They made nylon here. So the fumes he says he was exposed to was "monomer" and was hot chemical fumes  Pulmonary toxicity history is taken some prednisone in the past but otherwise pulmonary toxicity history is negative   HRCT 07/05/17 - personally visualized - Read by thoracic rad DrPoff - done [facility] - defnite UIP per Dr Ashley Murrain. I concur. There is Co artery calcification and dilated PA  OV 02/26/2018  Chief Complaint  Patient presents with  . Follow-up    Not doing well he walked back to the room and was at 70% he was then placed on 6l o2 and after several minutes he finally went up to 94 % and was backed down to 3l and stayed at 93%.   FU IPF - diagnosed 02/01/18. Started Ofev - 02/17/18      Simple office walk 185 feet x  3 laps goal with forehead probe 01/26/2018  03/14/2018   O2 used Room Air   Number laps completed All 3 laps   Comments about pace Slow pace   Resting Pulse Ox/HR 98%/105   Final Pulse Ox/HR 92/120   Desaturated </= 88% no   Desaturated <= 3% points yes   Got  Tachycardic >/= 90/min yes   Symptoms at end of test Mild dyspnea            has no past medical history on file.   reports that he has never smoked. He has never used smokeless tobacco.  No past surgical history on file.  Allergies  Allergen Reactions  . Neosporin [Neomycin-Polymyxin-Gramicidin] Other (See Comments)    Blisters     Immunization History  Administered Date(s) Administered  . Influenza, High Dose Seasonal PF 05/29/2017    No family history on file.   Current Outpatient Medications:  .  fexofenadine (ALLEGRA) 180 MG tablet, Take 180 mg by mouth daily., Disp: , Rfl:  .  fluticasone (FLONASE) 50 MCG/ACT nasal spray, Place 2 sprays into both nostrils daily., Disp: , Rfl:  .  OFEV 150 MG CAPS, , Disp: , Rfl:  .  Pseudoephedrine-DM-GG (ROBITUSSIN COLD & COUGH PO), Take by mouth., Disp: , Rfl:  .  VENTOLIN HFA 108 (90 Base) MCG/ACT inhaler, INHALE ONE PUFF INTO THE LUNGS FOUR TIMES DAILY AS NEEDED, Disp: , Rfl: 1    Review of Systems     Objective:   Physical Exam Today's Vitals   03/19/2018 1424 03/09/2018 1425  BP:  110/72  Pulse:  (!) 116  SpO2:  92%  Weight: 206 lb (93.4 kg)   Height: 6' (1.829 m)     Estimated body mass index is 27.94 kg/m as calculated from the following:   Height as of this encounter: 6' (1.829 m).   Weight as of this encounter: 206 lb (93.4 kg).      Assessment:     Acute on chronic hypoxemic resp fialure  deteriorated severe    Plan:     Admit [facility] tele Rule out CHF/PE Rx for IPF flaure  See billing and H&P separte   Dr. Kalman ShanMurali Daisa Stennis, M.D., [facility]P Pulmonary and Critical Care Medicine Staff Physician, [facility] Center Director - Interstitial Lung Disease  Program  Pulmonary Fibrosis [facility] at [facility] [place], [place], [id number]  Pager: [phone], If no answer or between  15:00h - 7:00h: call [phone] Telephone: [phone]

## 2018-03-24 NOTE — Progress Notes (Signed)
Patient transferred from MD office to the unit. Patient is alert and oriented x 4. Patient is in new onset afib on the heart monitor. Patient reports no chest pain. Rapid response called and are at bedside.

## 2018-03-24 NOTE — Assessment & Plan Note (Signed)
Rapid deterioration in a matter of 6 weeks. Worsening symptoms particularly in the last 3 weeks. Features of the only worrisome for IPF flare up versus aggressive progressive disease that can happen in less than 5% of patients with IPF this quick. Differential diagnoses includes some sort of heart failure versus pulmonary embolism versus respiratory viral infection  Plan  - Better served by admission - [facility] - Get CT angiogram of the chest to rule out pulmonary embolism and high resolution of the chest before the CT angiogram to evaluate for IPF flare up in the form of groundglass opacities - Get echocardiogram - Check routine blood work CBC, pro calcitonin, lactic acid, sepsis biomarkers and respiratory virus panel\ - check ABG  - Continue oxygenfor pulse ox greater than 88% - Bedrest with bathroom privileges if tolerated - DVT prophylaxis - PPI prophylaxis - Start empiric antibiotics would be escalated quickly based on Propulsid tone and lactic acid - start IV steroids - IV heparin andCT angiography shows pulmonary embolism  - Prognosis:explained To the family if this is IPF flare upor progressive IPF it is bad prognosis. We'll look for reversible causes that are slim probability and try to address this and hopefully can improve.  - Disposition: Depending on the outcome of current acute illness. We'll get a social work consult

## 2018-03-24 NOTE — Patient Instructions (Signed)
admit

## 2018-03-24 NOTE — H&P (Addendum)
PULMONARY / CRITICAL CARE MEDICINE   Name: Omar Mitchell MRN: [id number] DOB: 11-Aug-1936 PCP Omar Specking, MD LOS 0 as of April 11, 2018     ADMISSION DATE:  (April 11, 2018  CHIEF COMPLAINT:  wosening dyspnea and cough  HISTORY OF PRESENT ILLNESS:   Omar Mitchell , 82 y.o. , with dob 1935/12/17 and male ,Not Hispanic or Latino from [address] [place] [address] - presents to ILD at [facility] clinic for ILD followup. 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 I Omar Mitchell only known him since May 2019 when I first saw him for interstitial lung disease evaluation. He has a history of working at [employer]. At the end of the complete evaluation on 02/01/2018 I gave him a diagnosis of idiopathic pulmonary fibrosis and after discussion, committedhim to anti-fibrotic therapy - namely Ninetadanib which she has been taking for just over 1 month. Today's visit was to ensure that he was tolerating the anti-fibrotic therapy. At the time of last visit he was on room air oxygen and not desaturating with simple exertion in the office. However in the past 2 or 3 weeks his wife Omar Mitchell wed] and friend and the patient to me that he significantly deteriorated in terms of shortness of breath to the point even at rest he short of breath and doing simple exertion between rooms is extremely difficult also cough is worse in the some increase in volume of sputum. This no worsening or onset of edema or chest pain or hemoptysis or fever or chills or nausea vomiting or diarrhea or any other GI side effects that can be seen with the anti-fibrotic. When he walked into the office he desaturated to 69% on room air and required several minutes and 3 L of oxygen before he could settle at 93%. In the midst of all this they're looking at moving to assisted living facility versus remaining at home.    PAST MEDICAL HISTORY :  He  has no past medical history on  file.  PAST SURGICAL HISTORY: He  has no past surgical history on file.  Allergies  Allergen Reactions  . Neosporin [Neomycin-Polymyxin-Gramicidin] Other (See Comments)    Blisters     No current facility-administered medications on file prior to encounter.    Current Outpatient Medications on File Prior to Encounter  Medication Sig  . fexofenadine (ALLEGRA) 180 MG tablet Take 180 mg by mouth daily.  . fluticasone (FLONASE) 50 MCG/ACT nasal spray Place 2 sprays into both nostrils daily.  Marland Kitchen OFEV 150 MG CAPS   . Pseudoephedrine-DM-GG (ROBITUSSIN COLD & COUGH PO) Take by mouth.  . VENTOLIN HFA 108 (90 Base) MCG/ACT inhaler INHALE ONE PUFF INTO THE LUNGS FOUR TIMES DAILY AS NEEDED    FAMILY HISTORY:  His has no family status information on file.    SOCIAL HISTORY: He  reports that he has never smoked. He has never used smokeless tobacco.  REVIEW OF SYSTEMS:   As per history of present illness otherwise 11 point review of system is negative   VITAL SIGNS: Blood pressure 110/72, heart rate of 116, height of 6 feet, weight of 206 pounds, BMI 27.94 pulse ox 93% on 3 L nasal cannula    EXAM  General Appearance:    Looks  A bit more tired than usual.   Head:    Normocephalic, without obvious abnormality, atraumatic  Eyes:    PERRL -  yes, conjunctiva/corneas - clear      Ears:    Normal external ear canals, both ears  Nose:   NG tube - no but has [place] o2  Throat:  ETT TUBE - no , OG tube - no  Neck:   Supple,  No enlargement/tenderness/nodules     Lungs:     Bilateral crackles bibasally halfway uphim a tachypneic breathing around 25-30 times a minute, shallow respirations  Chest wall:    No deformity  Heart:    S1 and S2 normal, no murmur, CVP - no.  Pressors - no  Abdomen:     Soft, no masses, no organomegaly  Genitalia:    Not done  Rectal:   not done  Extremities:   Extremities- mild varicose veins but no warmth no edema     Skin:   Intact in exposed areas .       Neurologic:   Sedation - none -> RASS - +1 . Moves all 4s - yes. CAM-ICU - neg . Orientation - x 3 +, anxious       LABS  PULMONARY No results for input(s): PHART, PCO2ART, PO2ART, HCO3, TCO2, O2SAT in the last 168 hours.  Invalid input(s): PCO2, PO2  CBC No results for input(s): HGB, HCT, WBC, PLT in the last 168 hours.  COAGULATION No results for input(s): INR in the last 168 hours.  CARDIAC  No results for input(s): TROPONINI in the last 168 hours. No results for input(s): PROBNP in the last 168 hours.   CHEMISTRY No results for input(s): NA, K, CL, CO2, GLUCOSE, BUN, CREATININE, CALCIUM, MG, PHOS in the last 168 hours. CrCl cannot be calculated (No order found.).   LIVER No results for input(s): AST, ALT, ALKPHOS, BILITOT, PROT, ALBUMIN, INR in the last 168 hours.   INFECTIOUS No results for input(s): LATICACIDVEN, PROCALCITON in the last 168 hours.   ENDOCRINE CBG (last 3)  No results for input(s): GLUCAP in the last 72 hours.       IMAGING x48h  - image(s) personally visualized  -   highlighted in bold No results found.     ASSESSMENT and PLAN  Acute on chronic respiratory failure (HCC) Rapid deterioration in a matter of 6 weeks. Worsening symptoms particularly in the last 3 weeks. Features of the only worrisome for IPF flare up versus aggressive progressive disease that can happen in less than 5% of patients with IPF this quick. Differential diagnoses includes some sort of heart failure versus pulmonary embolism versus respiratory viral infection  Plan  - Better served by admission - [facility] - Get CT angiogram of the chest to rule out pulmonary embolism and high resolution of the chest before the CT angiogram to evaluate for IPF flare up in the form of groundglass opacities - Get echocardiogram - Check routine blood work CBC, pro calcitonin, lactic acid, sepsis biomarkers and respiratory virus panel\ -  check ABG  - Continue oxygenfor pulse ox greater than 88% - Bedrest with bathroom privileges if tolerated - DVT prophylaxis - PPI prophylaxis - Start empiric antibiotics would be escalated quickly based on Propulsid tone and lactic acid - start IV steroids - IV heparin andCT angiography shows pulmonary embolism  - Prognosis:explained To the family if this is IPF flare upor progressive IPF it is bad prognosis. We'll look for reversible causes that are slim probability and try to address this and hopefully can improve.  - Disposition: Depending on the outcome of  current acute illness. We'll get a social work consult      FAMILY  - Updates: 03/27/2018 --> wife, patient and friend at office  - Inter-disciplinary family meet or Palliative Care meeting due by:  DAy 7. Current LOS is LOS 0 days  CODE STATUS     DISPO Med surg tele [place]     Dr. Kalman Shan, M.D., [facility].P Pulmonary and Critical Care Medicine Staff Physician, [facility] Center Director - Interstitial Lung Disease  Program  Pulmonary Fibrosis [facility] Network at [facility] [place], [place], [address]  Pager: [phone], If no answer or between  15:00h - 7:00h: call [phone] Telephone: [phone]

## 2018-03-25 ENCOUNTER — Inpatient Hospital Stay (HOSPITAL_COMMUNITY): Payer: Medicare Other

## 2018-03-25 ENCOUNTER — Encounter (HOSPITAL_COMMUNITY): Payer: Self-pay

## 2018-03-25 ENCOUNTER — Other Ambulatory Visit: Payer: Self-pay

## 2018-03-25 DIAGNOSIS — I5021 Acute systolic (congestive) heart failure: Secondary | ICD-10-CM

## 2018-03-25 DIAGNOSIS — I481 Persistent atrial fibrillation: Secondary | ICD-10-CM

## 2018-03-25 DIAGNOSIS — I340 Nonrheumatic mitral (valve) insufficiency: Secondary | ICD-10-CM

## 2018-03-25 LAB — BASIC METABOLIC PANEL
Anion gap: 9 (ref 5–15)
BUN: 14 mg/dL (ref 8–23)
CO2: 27 mmol/L (ref 22–32)
Calcium: 8.5 mg/dL — ABNORMAL LOW (ref 8.9–10.3)
Chloride: 100 mmol/L (ref 98–111)
Creatinine, Ser: 0.79 mg/dL (ref 0.61–1.24)
GFR calc Af Amer: >60 mL/min (ref >60)
GFR calc non Af Amer: >60 mL/min (ref >60)
Glucose, Bld: 187 mg/dL — ABNORMAL HIGH (ref 70–99)
Potassium: 4.4 mmol/L (ref 3.5–5.1)
Sodium: 136 mmol/L (ref 135–145)

## 2018-03-25 LAB — RESPIRATORY PANEL BY PCR
Adenovirus: NOT DETECTED
Bordetella pertussis: NOT DETECTED
CORONAVIRUS OC43-RVPPCR: NOT DETECTED
Chlamydophila pneumoniae: NOT DETECTED
Coronavirus 229E: NOT DETECTED
Coronavirus HKU1: NOT DETECTED
Coronavirus NL63: NOT DETECTED
INFLUENZA A-RVPPCR: NOT DETECTED
INFLUENZA B-RVPPCR: NOT DETECTED
METAPNEUMOVIRUS-RVPPCR: NOT DETECTED
Mycoplasma pneumoniae: NOT DETECTED
PARAINFLUENZA VIRUS 2-RVPPCR: NOT DETECTED
PARAINFLUENZA VIRUS 3-RVPPCR: NOT DETECTED
PARAINFLUENZA VIRUS 4-RVPPCR: NOT DETECTED
Parainfluenza Virus 1: NOT DETECTED
RESPIRATORY SYNCYTIAL VIRUS-RVPPCR: NOT DETECTED
RHINOVIRUS / ENTEROVIRUS - RVPPCR: NOT DETECTED

## 2018-03-25 LAB — ECHOCARDIOGRAM COMPLETE
Height: 73 in
WEIGHTICAEL: 3296 [oz_av]

## 2018-03-25 LAB — CBC
HCT: 40.9 % (ref 39.0–52.0)
Hemoglobin: 13.4 g/dL (ref 13.0–17.0)
MCH: 32.1 pg (ref 26.0–34.0)
MCHC: 32.8 g/dL (ref 30.0–36.0)
MCV: 97.8 fL (ref 78.0–100.0)
PLATELETS: 245 10*3/uL (ref 150–400)
RBC: 4.18 MIL/uL — ABNORMAL LOW (ref 4.22–5.81)
RDW: 14.7 % (ref 11.5–15.5)
WBC: 8 10*3/uL (ref 4.0–10.5)

## 2018-03-25 LAB — GLUCOSE, CAPILLARY
GLUCOSE-CAPILLARY: 196 mg/dL — AB (ref 70–99)
GLUCOSE-CAPILLARY: 167 mg/dL — AB (ref 70–99)
Glucose-Capillary: 160 mg/dL — ABNORMAL HIGH (ref 70–99)

## 2018-03-25 LAB — HEPARIN LEVEL (UNFRACTIONATED): HEPARIN UNFRACTIONATED: 0.44 [IU]/mL (ref 0.30–0.70)

## 2018-03-25 LAB — TROPONIN I: Troponin I: <0.03 ng/mL (ref <0.03)

## 2018-03-25 LAB — PHOSPHORUS: Phosphorus: 3.1 mg/dL (ref 2.5–4.6)

## 2018-03-25 LAB — MAGNESIUM: Magnesium: 2.1 mg/dL (ref 1.7–2.4)

## 2018-03-25 MED ORDER — IOPAMIDOL (ISOVUE-370) INJECTION 76%
INTRAVENOUS | Status: AC
  Filled 2018-03-25: qty 100

## 2018-03-25 MED ORDER — LEVALBUTEROL HCL 0.63 MG/3ML IN NEBU
0.6300 mg | INHALATION_SOLUTION | RESPIRATORY_TRACT | Status: DC | PRN
Start: 2018-03-25 — End: 2018-03-26
  Administered 2018-03-26: 0.63 mg via RESPIRATORY_TRACT
  Filled 2018-03-25: qty 3

## 2018-03-25 MED ORDER — HEPARIN (PORCINE) IN NACL 100-0.45 UNIT/ML-% IJ SOLN
1300.0000 [IU]/h | INTRAMUSCULAR | Status: AC
  Administered 2018-03-25 – 2018-03-26 (×2): 1300 [IU]/h via INTRAVENOUS
  Filled 2018-03-25 (×2): qty 250

## 2018-03-25 MED ORDER — INSULIN ASPART 100 UNIT/ML ~~LOC~~ SOLN
0.0000 [IU] | Freq: Three times a day (TID) | SUBCUTANEOUS | Status: DC
  Administered 2018-03-25 (×2): 3 [IU] via SUBCUTANEOUS
  Administered 2018-03-26: 2 [IU] via SUBCUTANEOUS
  Administered 2018-03-26 – 2018-03-29 (×9): 3 [IU] via SUBCUTANEOUS
  Administered 2018-03-29: 5 [IU] via SUBCUTANEOUS
  Administered 2018-03-29: 3 [IU] via SUBCUTANEOUS

## 2018-03-25 MED ORDER — METOPROLOL TARTRATE 25 MG PO TABS
25.0000 mg | ORAL_TABLET | Freq: Two times a day (BID) | ORAL | Status: DC
  Administered 2018-03-25 – 2018-03-26 (×3): 25 mg via ORAL
  Filled 2018-03-25 (×5): qty 1

## 2018-03-25 MED ORDER — HEPARIN BOLUS VIA INFUSION
3500.0000 [IU] | Freq: Once | INTRAVENOUS | Status: AC
  Administered 2018-03-25: 3500 [IU] via INTRAVENOUS
  Filled 2018-03-25: qty 3500

## 2018-03-25 MED ORDER — INSULIN ASPART 100 UNIT/ML ~~LOC~~ SOLN
0.0000 [IU] | Freq: Every day | SUBCUTANEOUS | Status: DC
  Administered 2018-03-27: 3 [IU] via SUBCUTANEOUS
  Administered 2018-03-28: 2 [IU] via SUBCUTANEOUS

## 2018-03-25 MED ORDER — FLUTICASONE PROPIONATE 50 MCG/ACT NA SUSP
1.0000 | Freq: Every day | NASAL | Status: DC
  Administered 2018-03-25 – 2018-03-27 (×3): 1 via NASAL
  Filled 2018-03-25: qty 16

## 2018-03-25 MED ORDER — POTASSIUM CHLORIDE CRYS ER 20 MEQ PO TBCR
40.0000 meq | EXTENDED_RELEASE_TABLET | Freq: Once | ORAL | Status: AC
  Administered 2018-03-25: 40 meq via ORAL
  Filled 2018-03-25: qty 2

## 2018-03-25 MED ORDER — FUROSEMIDE 10 MG/ML IJ SOLN
40.0000 mg | Freq: Once | INTRAMUSCULAR | Status: AC
  Administered 2018-03-25: 40 mg via INTRAVENOUS
  Filled 2018-03-25: qty 4

## 2018-03-25 MED ORDER — SALINE SPRAY 0.65 % NA SOLN
1.0000 | Freq: Two times a day (BID) | NASAL | Status: DC
  Administered 2018-03-25 – 2018-03-29 (×8): 1 via NASAL
  Filled 2018-03-25: qty 44

## 2018-03-25 MED ORDER — IPRATROPIUM BROMIDE 0.02 % IN SOLN: 0.5000 mg | RESPIRATORY_TRACT | Status: DC | PRN

## 2018-03-25 MED ORDER — IOPAMIDOL (ISOVUE-370) INJECTION 76%
100.0000 mL | Freq: Once | INTRAVENOUS | Status: AC | PRN
  Administered 2018-03-25: 100 mL via INTRAVENOUS

## 2018-03-25 NOTE — Progress Notes (Signed)
Echo 03/25/18 >> EF 40 to 45%, mod hypokinesis of mid-apical anterior septal region, mild MR   Have placed called to have cardiology assess patient for new dx of systolic CHF and a fib with RVR.  Coralyn HellingVineet Adalay Azucena, MD [facility]monary/Critical Care 03/25/2018, 2:04 PM

## 2018-03-25 NOTE — Progress Notes (Signed)
PULMONARY / CRITICAL CARE MEDICINE   Name: Omar Mitchell MRN: [id number] DOB: November 29, 1935    ADMISSION DATE:  03/18/2018  CHIEF COMPLAINT:  Short of breath  HISTORY OF PRESENT ILLNESS:   82 yo male never smoker admitted from pulmonary with progressive dyspnea.  Has hx of idiopathic pulmonary fibrosis.  Noted to have pneumonitis on CT chest and A fib with RVR.  PAST MEDICAL HISTORY :  Anxiety, dysrhythmia, allergies  SUBJECTIVE:  Reports feeling weak with exertion.  Wasn't on oxygen at home.  Denies fever, sore throat, chest pain, sputum, hemoptysis, joint swelling, skin rash, sick exposures.  He was recently started on Ofev, but his symptoms predate this.  He has been getting scheduled nebs in hospital >> he isn't sure how much these help.   VITAL SIGNS: BP 93/64   Pulse (!) 113   Temp 97.7 F (36.5 C) (Oral)   Resp (!) 24   Ht _0  (1.854 m)   Wt 206 lb (93.4 kg)   SpO2 95%   BMI 27.18 kg/m   INTAKE / OUTPUT: I/O last 3 completed shifts: In: 120.2 [I.V.:120.2] Out: 700 [Urine:700]  PHYSICAL EXAMINATION:  General - pleasant Eyes - pupils reactive ENT - no sinus tenderness, no oral exudate, no LAN Cardiac - irregular, tachycardic, no murmur Chest - b/l crackles more in upper lobes Abd - soft, non tender Ext - no edema Skin - no rashes Neuro - normal strength Psych - normal mood   LABS:  BMET Recent Labs  Lab 03/03/2018 1713 03/25/18 0501  NA 136 136  K 4.1 4.4  CL 97* 100  CO2 28 27  BUN 15 14  CREATININE 0.84 0.79  GLUCOSE 131* 187*    Electrolytes Recent Labs  Lab 03/26/2018 1713 03/25/18 0501  CALCIUM 8.4* 8.5*  MG 1.9 2.1  PHOS 2.6 3.1    CBC Recent Labs  Lab 03/07/2018 1713 03/25/18 0501  WBC 11.6* 8.0  HGB 14.0 13.4  HCT 42.4 40.9  PLT 258 245    Coag's No results for input(s): APTT, INR in the last 168 hours.  Sepsis Markers Recent Labs  Lab 03/02/2018 1713 03/22/2018 1950  LATICACIDVEN 2.6* 2.4*  PROCALCITON <0.10  --      ABG Recent Labs  Lab 03/14/2018 1615  PHART 7.498*  PCO2ART 33.5  PO2ART 67.8*    Liver Enzymes Recent Labs  Lab 03/09/2018 1713  AST 28  ALT 22  ALKPHOS 90  BILITOT 1.2  ALBUMIN 2.8*    Cardiac Enzymes Recent Labs  Lab 03/18/2018 1713 02/28/2018 2220 03/25/18 0501  TROPONINI <0.03 <0.03 <0.03    Glucose No results for input(s): GLUCAP in the last 168 hours.  Imaging Ct Angio Chest Pe W Or Wo Contrast  Result Date: 03/25/2018 CLINICAL DATA:  82 y/o M; persistent dry cough worse at night and shortness of breath for 4-6 months. Leukocytosis. History of pulmonary fibrosis. EXAM: CT ANGIOGRAPHY CHEST WITH CONTRAST TECHNIQUE: Multidetector CT imaging of the chest was performed using the standard protocol during bolus administration of intravenous contrast. Multiplanar CT image reconstructions and MIPs were obtained to evaluate the vascular anatomy. CONTRAST:  173m ISOVUE-370 IOPAMIDOL (ISOVUE-370) INJECTION 76% COMPARISON:  07/05/2017 CT chest. Concurrent 03/25/2018 high-resolution CT of the chest. FINDINGS: Cardiovascular: Mild coronary artery calcific atherosclerosis. Mitral annular calcification. Normal caliber thoracic aorta with mild calcific atherosclerosis. Enlarged main pulmonary artery to 3.7 cm. Satisfactory opacification of the pulmonary arteries. Respiratory motion artifact in the lung bases. No pulmonary embolus identified. Mediastinum/Nodes:  Normal thyroid gland. Normal thoracic esophagus. Mild mediastinal, subcarinal, and bilateral hilar lymphadenopathy, likely reactive. Lungs/Pleura: Pulmonary fibrosis with a peripheral and basilar predominance and honeycombing similar to the 2018 CT of the chest. Interval development of diffuse ground-glass opacities of the lungs with focal areas of lobular sparing. Upper Abdomen: Cholelithiasis. 14 mm left adrenal nodule measuring -27 HU with focal fat, consistent with myelolipoma Musculoskeletal: No chest wall abnormality. No acute or  significant osseous findings. Review of the MIP images confirms the above findings. IMPRESSION: 1. Respiratory motion artifact in the lung bases. No pulmonary embolus identified. 2. Pulmonary fibrosis with peripheral and basilar predominance and honeycombing. Please refer to concurrent high-resolution CT of the chest for further characterization. 3. Diffuse ground-glass opacities with lobular areas of sparing, possibly superimposed pulmonary edema or pneumonitis. 4. Cholelithiasis. 5. 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 6. Enlarged main pulmonary artery compatible pulmonary artery hypertension. 7. Mild aortic and coronary artery calcific atherosclerosis. Electronically Signed   By: Kristine Garbe M.D.   On: 03/25/2018 03:35     STUDIES:  Serology 01/26/18 >> HP panel, RNP, aldolase, ANCA, Scl 70, SSA/SSB, RF, anti CCP, ANA, anti DS dna, ACE all negative PFT 02/01/18 >> FEV1 1.88 (59%), FEV1% 85, DLCO 40% CT angio chest 03/25/18 >> mild coronary atherosclerosis, enlarged PA, peripheral and basilar predominant fibrosis and honeycombing with areas of GGO Echo 6/28 >>   CULTURES: Blood 6/27 >>  Respiratory viral panel 6/27 >> negative  ANTIBIOTICS:  SIGNIFICANT EVENTS: 6/27 Admit from pulmonary office, start steroids 6/28 start heparin gtt  LINES/TUBES:  DISCUSSION: He has hx of IPF.  He has progressive dyspnea with hypoxia.  Found to have new finding of GGO on CT chest, and has developed A fib with RVR.  Negative procalcitonin and negative respiratory viral panel speak against infectious process.  Differential is either acute pneumonitis in setting of pulmonary fibrosis and/or acute pulmonary edema with A fib and RVR.  ASSESSMENT / PLAN:  Acute hypoxic respiratory failure. - oxygen to keep SpO2 > 90%  Acute pneumonitis with hx of IPF. - continue solumedrol - check ESR - f/u CXR - change BDs to prn  A fib with RVR >> CHA2DS2-Vasc score 2. Acute pulmonary edema. - cardizem for  rate control - start heparin gtt - lasix 40 mg IV x one 6/28 - f/u Echo - check BNP  Steroid induced hyperglycemia. - SSI  Allergic rhinitis. - flonase, nasal saline spray  DVT prophylaxis - heparin gtt SUP - pepcid Nutrition - regular diet Goals of care - full code  Chesley Mires, MD [place] 03/25/2018, 9:44 AM

## 2018-03-25 NOTE — Progress Notes (Signed)
ANTICOAGULATION CONSULT NOTE - Follow Up Consult  Pharmacy Consult for Heparin Indication: atrial fibrillation  Patient Measurements: Height: 6\' 1"  (185.4 cm) Weight: 206 lb (93.4 kg) IBW/kg (Calculated) : 79.9 Heparin Dosing Weight: 93 kg  Assessment: 82 yoM admitted with SOB and noted to be in afib with RVR.  Pharmacy is consulted to dose Heparin IV.  First HL 0.44 is therapeutic on heparin at 1300 units/hr.  No bleeding or complications reported.  Goal of Therapy:  Heparin level 0.3-0.7 units/ml Monitor platelets by anticoagulation protocol: Yes   Plan:   Continue heparin IV infusion at 1300 units/hr  Heparin level in 8 hours to confirm therapeutic rate.  Daily heparin level and CBC   Lynann Beaverhristine Cheyane Ayon PharmD, BCPS Pager [phone] 03/25/2018 8:26 PM

## 2018-03-25 NOTE — Consult Note (Addendum)
Cardiology Consultation:   Patient ID: Omar Mitchell; [id number]; 1935/12/03   Admit date: 03/23/2018 Date of Consult: 03/25/2018  Primary Care Provider: Ignatius Specking, MD Primary Cardiologist: Kristeen Miss, MD - New  Patient Profile:   Omar Mitchell is a 82 y.o. male with a hx of interstitial lung disease/idiopathic pulmonary fibrosis who is being seen today for the evaluation of new onset atrial fibrillation and heart failure at the request of Dr. Craige Cotta.  History of Present Illness:   Omar Mitchell was recently diagnosed with idiopathic pulmonary fibrosis by Dr. Marchelle Gearing in May of this year.  He started anti-fibrotic therapy and was seen in the office on 02/26/2018 for follow-up to assess his tolerance.  At that time his wife noted that the patient had significantly deteriorated in the prior 2 to 3 weeks with increased shortness of breath with doing very simple exertion.  He had no worsening or onset of edema or chest pain.  He was also desaturating down to 69% on room air.  Because of this he was admitted to the hospital for acute on chronic respiratory failure with initiation of empiric antibiotics and IV steroids.  A CTA of the chest ruled out pulmonary embolism.  It did show mild aortic and coronary artery calcifications.  Labs revealed a BNP of 445.4.  Troponins were negative x3.  An echocardiogram showed mildly to moderately reduced LV systolic function with EF 40-45%, moderate hypokinesis of the mid apical anteroseptal myocardium  Omar Mitchell states that at his baseline he could walk across the house, prepare breakfast and walk back across the house without having to stop for breathing.  About 3 to 4 weeks ago he developed progressively worsening dyspnea on exertion until he had trouble walking across the room without being out of breath.  He does not use home oxygen.  He denies having had any chest pain or palpitations, but he noted that his chest felt funny especially when he was having an  episode of shortness of breath.  He also admits to orthopnea and has had some mild ankle swelling, more on the left.  He denies any prior cardiac history or testing.  He also denies hypertension, diabetes, stroke, cholesterol issues.  He has never been diagnosed with irregular heartbeat or atrial fibrillation.  He has never smoked and he does not drink alcohol.  Past Medical History:  Diagnosis Date  . Anxiety   . Dysrhythmia   . Pulmonary fibrosis (HCC)     Past Surgical History:  Procedure Laterality Date  . EYE SURGERY     bilateral cataract , "RK" surgery  . HERNIA REPAIR  age 52   groin are     Home Medications:  Prior to Admission medications   Medication Sig Start Date End Date Taking? Authorizing Provider  fexofenadine (ALLEGRA) 180 MG tablet Take 180 mg by mouth daily.   Yes [provider]  fluticasone (FLONASE) 50 MCG/ACT nasal spray Place 2 sprays into both nostrils daily.   Yes [provider]  ibuprofen (ADVIL,MOTRIN) 200 MG tablet Take 400 mg by mouth every 6 (six) hours as needed for moderate pain.   Yes [provider]  Ibuprofen-diphenhydrAMINE Cit (ADVIL PM) 200-38 MG TABS Take 1 tablet by mouth at bedtime as needed.   Yes [provider]  OFEV 150 MG CAPS Take 150 mg by mouth 2 (two) times daily.  03/14/18  Yes [provider]  Pseudoephedrine-DM-GG (ROBITUSSIN COLD & COUGH PO) Take 15 mLs by mouth every 4 (  four) hours as needed (cough).    Yes [provider]  VENTOLIN HFA 108 (90 Base) MCG/ACT inhaler INHALE ONE PUFF INTO THE LUNGS FOUR TIMES DAILY AS NEEDED shortness of breath 11/28/17  Yes [provider]    Inpatient Medications: Scheduled Meds: . famotidine  20 mg Oral BID  . fluticasone  1 spray Each Nare Daily  . insulin aspart  0-15 Units Subcutaneous TID WC  . insulin aspart  0-5 Units Subcutaneous QHS  . methylPREDNISolone (SOLU-MEDROL) injection  80 mg Intravenous Q6H  . sodium chloride  1  spray Each Nare BID   Continuous Infusions: . sodium chloride 10 mL/hr at 03/25/18 0600  . diltiazem (CARDIZEM) infusion 15 mg/hr (03/25/18 1055)  . heparin 1,300 Units/hr (03/25/18 1021)   PRN Meds: sodium chloride, ALPRAZolam, guaiFENesin-dextromethorphan, ipratropium, levalbuterol  Allergies:    Allergies  Allergen Reactions  . Neosporin [Neomycin-Polymyxin-Gramicidin] Other (See Comments)    Blisters     Social History:   Social History   Socioeconomic History  . Marital status: Married    Spouse name: Not on file  . Number of children: Not on file  . Years of education: Not on file  . Highest education level: Not on file  Occupational History  . Not on file  Social Needs  . Financial resource strain: Not on file  . Food insecurity:    Worry: Not on file    Inability: Not on file  . Transportation needs:    Medical: Not on file    Non-medical: Not on file  Tobacco Use  . Smoking status: Never Smoker  . Smokeless tobacco: Never Used  Substance and Sexual Activity  . Alcohol use: Never    Frequency: Never  . Drug use: Never  . Sexual activity: Not on file  Lifestyle  . Physical activity:    Days per week: Not on file    Minutes per session: Not on file  . Stress: Not on file  Relationships  . Social connections:    Talks on phone: Not on file    Gets together: Not on file    Attends religious service: Not on file    Active member of club or organization: Not on file    Attends meetings of clubs or organizations: Not on file    Relationship status: Not on file  . Intimate partner violence:    Fear of current or ex partner: Not on file    Emotionally abused: Not on file    Physically abused: Not on file    Forced sexual activity: Not on file  Other Topics Concern  . Not on file  Social History Narrative  . Not on file    Family History:    Family History  Problem Relation Age of Onset  . Cancer Mother   . Dementia Father        died at age 44    . CAD Brother        dies of MI     ROS:  Please see the history of present illness.   All other ROS reviewed and negative.     Physical Exam/Data:   Vitals:   03/25/18 0800 03/25/18 1100 03/25/18 1410 03/25/18 1530  BP: 119/71  116/71   Pulse: 69  (!) 35   Resp: (!) 28  (!) 31   Temp:  97.6 F (36.4 C)  97.6 F (36.4 C)  TempSrc:  Oral  Oral  SpO2: 92%  91%  Weight:      Height:        Intake/Output Summary (Last 24 hours) at 03/25/2018 1600 Last data filed at 03/25/2018 1213 Gross per 24 hour  Intake 120.17 ml  Output 1625 ml  Net -1504.83 ml   Filed Weights   03/26/2018 1640  Weight: 206 lb (93.4 kg)   Body mass index is 27.18 kg/m.  General: Elderly male patient, in no acute distress HEENT: normal Lymph: no adenopathy Neck: no JVD Endocrine:  No thryomegaly Vascular: No carotid bruits; FA pulses 2+ bilaterally without bruits  Cardiac:  normal S1, S2; irregularly irregular rhythm, tachycardic; no murmur  Lungs:  clear to auscultation bilaterally except fibrotic scratchy crackles, no wheezing, rhonchi or rales  Abd: soft, nontender, no hepatomegaly  Ext: Trace left ankle edema Musculoskeletal:  No deformities, BUE and BLE strength normal and equal Skin: warm and dry  Neuro:  CNs 2-12 intact, no focal abnormalities noted Psych:  Normal affect   EKG:  The EKG was personally reviewed and demonstrates: Atrial fibrillation with rapid ventricular response, 143 bpm Telemetry:  Telemetry was personally reviewed and demonstrates: Atrial fibrillation with rates 110s-130  Relevant CV Studies:  Echocardiogram 03/25/2018 Study Conclusions  - Left ventricle: The cavity size was normal. Wall thickness was   normal. Systolic function was mildly to moderately reduced. The   estimated ejection fraction was in the range of 40% to 45%.   Moderate hypokinesis of the mid-apicalanteroseptal myocardium. - Mitral valve: There was mild regurgitation. - Left atrium: The atrium  was mildly dilated. - Right ventricle: The cavity size was mildly dilated. Systolic   function was mildly reduced. - Right atrium: The atrium was mildly dilated.   Laboratory Data:  Chemistry Recent Labs  Lab 03/20/2018 1713 03/25/18 0501  NA 136 136  K 4.1 4.4  CL 97* 100  CO2 28 27  GLUCOSE 131* 187*  BUN 15 14  CREATININE 0.84 0.79  CALCIUM 8.4* 8.5*  GFRNONAA >60 >60  GFRAA >60 >60  ANIONGAP 11 9    Recent Labs  Lab 03/22/2018 1713  PROT 7.1  ALBUMIN 2.8*  AST 28  ALT 22  ALKPHOS 90  BILITOT 1.2   Hematology Recent Labs  Lab 03/21/2018 1713 03/25/18 0501  WBC 11.6* 8.0  RBC 4.37 4.18*  HGB 14.0 13.4  HCT 42.4 40.9  MCV 97.0 97.8  MCH 32.0 32.1  MCHC 33.0 32.8  RDW 14.6 14.7  PLT 258 245   Cardiac Enzymes Recent Labs  Lab 03/05/2018 1713 03/03/2018 2220 03/25/18 0501  TROPONINI <0.03 <0.03 <0.03   No results for input(s): TROPIPOC in the last 168 hours.  BNP Recent Labs  Lab 03/17/2018 1713  BNP 445.4*    DDimer No results for input(s): DDIMER in the last 168 hours.  Radiology/Studies:  Ct Angio Chest Pe W Or Wo Contrast  Result Date: 03/25/2018 CLINICAL DATA:  82 y/o M; persistent dry cough worse at night and shortness of breath for 4-6 months. Leukocytosis. History of pulmonary fibrosis. EXAM: CT ANGIOGRAPHY CHEST WITH CONTRAST TECHNIQUE: Multidetector CT imaging of the chest was performed using the standard protocol during bolus administration of intravenous contrast. Multiplanar CT image reconstructions and MIPs were obtained to evaluate the vascular anatomy. CONTRAST:  ISOVUE-370 IOPAMIDOL (ISOVUE-370) INJECTION 76% COMPARISON:  07/05/2017 CT chest. Concurrent 03/25/2018 high-resolution CT of the chest. FINDINGS: Cardiovascular: Mild coronary artery calcific atherosclerosis. Mitral annular calcification. Normal caliber thoracic aorta with mild calcific atherosclerosis. Enlarged main pulmonary artery to 3.7  cm. Satisfactory opacification of the  pulmonary arteries. Respiratory motion artifact in the lung bases. No pulmonary embolus identified. Mediastinum/Nodes: Normal thyroid gland. Normal thoracic esophagus. Mild mediastinal, subcarinal, and bilateral hilar lymphadenopathy, likely reactive. Lungs/Pleura: Pulmonary fibrosis with a peripheral and basilar predominance and honeycombing similar to the 2018 CT of the chest. Interval development of diffuse ground-glass opacities of the lungs with focal areas of lobular sparing. Upper Abdomen: Cholelithiasis. 14 mm left adrenal nodule measuring -27 HU with focal fat, consistent with myelolipoma Musculoskeletal: No chest wall abnormality. No acute or significant osseous findings. Review of the MIP images confirms the above findings. IMPRESSION: 1. Respiratory motion artifact in the lung bases. No pulmonary embolus identified. 2. Pulmonary fibrosis with peripheral and basilar predominance and honeycombing. Please refer to concurrent high-resolution CT of the chest for further characterization. 3. Diffuse ground-glass opacities with lobular areas of sparing, possibly superimposed pulmonary edema or pneumonitis. 4. Cholelithiasis. 5. 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 6. Enlarged main pulmonary artery compatible pulmonary artery hypertension. 7. Mild aortic and coronary artery calcific atherosclerosis. Electronically Signed   By: Mitzi HansenLance  Furusawa-Stratton M.D.   On: 03/25/2018 03:35    Assessment and Plan:   New onset atrial fibrillation -No prior known history of atrial fibrillation. -Patient with 3 to 4-week history of progressively worsening dyspnea on exertion.  No chest pain pressure or tightness but he has had a "funny feeling" in his chest especially during the times when he was most short of breath.  It is likely that he has been having A. fib with RVR for some or most of these last few weeks that has contributed to his symptoms. -The patient has been started on a diltiazem drip, currently at 10 mg/h.   Blood pressure is stable, a little soft.  Heart rates are still not well controlled with rates 110s-130.  We will add low-dose beta-blocker.  Watch blood pressure and wean diltiazem as heart rate hopefully improves. -CHA2DS2/VAS Stroke Risk score is  3 (CHF, Age (2)).  He has been started on a heparin drip for stroke risk reduction.  He should be transitioned to a DOAC prior to discharge, such as Eliquis 5 mg twice daily -With his significant symptoms he would be better off in sinus rhythm.  We will need to monitor his tolerance once we get his rate under control.  We could consider electrical cardioversion, however with his suspected longer duration of atrial fib he would need a TEE prior to cardioversion which would be difficult with his resp status or we could treat with rate control and continue anticoagulation for 3 weeks and then arrange for outpatient cardioversion.      Acute systolic heart failure -BNP of 445.4.  Troponins were negative x3.   -An echocardiogram showed mildly to moderately reduced LV systolic function with EF 40-45%, moderate hypokinesis of the mid apical anteroseptal myocardium.  No prior echo for comparison.  No previous known cardiac history -This may possibly be related to being in atrial fibrillation however also could be ischemic, considering wall motion abnormalities. CTA of the chest revealed mild aortic and coronary artery calcifications.  The patient has no history of hypertension, diabetes or stroke.  Lipid status is unknown.  The patient has had no chest pain.  He does have dyspnea on exertion which could be related to the atrial fibrillation with RVR.  Interstitial lung disease -Followed by Dr. Marchelle Gearingamaswamy- idiopathic pulmonary fibrosis diagnosed in May of this year.  He started anti-fibrotic therapy -It has been confusing  over the past few weeks to distinguish shortness of breath related to his ILD or possibly related to atrial fibrillation   For questions or  updates, please contact [facility] Please consult [email or web address] for contact info under Cardiology/STEMI.   Signed, Berton Bon, NP  03/25/2018 4:00 PM   Attending Note:   The patient was seen and examined.  Agree with assessment and plan as noted above.  Changes made to the above note as needed.  Patient seen and independently examined with Lizabeth Leyden, NP.   We discussed all aspects of the encounter. I agree with the assessment and plan as stated above.  1.  Atrial fibrillation: Patient has a history of severe lung disease-idiopathic pulmonary fibrosis.  He now has developed atrial fibrillation.  He presents with several weeks of shortness of breath and size intolerance.  Found to have atrial fibrillation with a rapid ventricular response.  He is been placed on a Cardizem drip as well as a heparin drip.  We have started him on metoprolol.  His heart rate is gradually improving.  At this point I think considering a transesophageal echo/cardioversion might be a bit risky.  If he feels much better and his rate is well controlled on metoprolol and diltiazem then I think that we should wait 3 or 4 weeks and do an elective cardioversion at that time.  If he does not tolerate these medications or if he continues to have hypoxemia then we may need to consider a TEE cardioversion or urgently.    TEE cardioversion would have to be done with the help of anesthesia managing the airway .     I have spent a total of 40 minutes with patient reviewing hospital  notes , telemetry, EKGs, labs and examining patient as well as establishing an assessment and plan that was discussed with the patient. > 50% of time was spent in direct patient care.    Vesta Mixer, Montez Hageman., MD, [facility] 03/25/2018, 6:33 PM [address],  [address] Office [phone] Pager [phone]

## 2018-03-25 NOTE — Progress Notes (Signed)
  Echocardiogram 2D Echocardiogram has been performed.  Omar Mitchell Omar Mitchell 03/25/2018, 9:36 AM

## 2018-03-25 NOTE — Progress Notes (Signed)
ANTICOAGULATION CONSULT NOTE - Initial Consult  Pharmacy Consult for heparin Indication: atrial fibrillation  Allergies  Allergen Reactions  . Neosporin [Neomycin-Polymyxin-Gramicidin] Other (See Comments)    Blisters     Patient Measurements: Height: 6\' 1"  (185.4 cm) Weight: 206 lb (93.4 kg) IBW/kg (Calculated) : 79.9 Heparin Dosing Weight: 93 kg  Vital Signs: Temp: 97.7 F (36.5 C) (06/28 0715) Temp Source: Oral (06/28 0715) BP: 93/64 (06/28 0600) Pulse Rate: 113 (06/28 0600)  Labs: Recent Labs    03/02/2018 1713 03/07/2018 2220 03/25/18 0501  HGB 14.0  --  13.4  HCT 42.4  --  40.9  PLT 258  --  245  CREATININE 0.84  --  0.79  TROPONINI <0.03 <0.03 <0.03    Estimated Creatinine Clearance: 83.2 mL/min (by C-G formula based on SCr of 0.79 mg/dL).   Medical History: Past Medical History:  Diagnosis Date  . Anxiety   . Dysrhythmia   . Pulmonary fibrosis (HCC)     Assessment: 82 YOM presents with shortness of breath, has h/o IPF.  Patient found to be in atrial fibrillation with RVR (CHA2DS2-Vasc = 2). Pharmacy asked to dose heparin gtt.   Today, 03/25/2018  CBC WNL  Renal fx WNL  Heparin SQ given at 0643 this am - order stopped   Goal of Therapy:  Heparin level 0.3-0.7 units/ml Monitor platelets by anticoagulation protocol: Yes   Plan:   Heparin 3500 unit bolus then heparin 1300 units/hr  Check 8h heparin level  Daily heparin level and CBC  Await plans for long-term anticoagulation  Monitor for bleeding  Juliette Alcideustin Orlen Leedy, PharmD, BCPS.   Pager: [phone] 03/25/2018 10:01 AM

## 2018-03-26 ENCOUNTER — Inpatient Hospital Stay (HOSPITAL_COMMUNITY): Payer: Medicare Other

## 2018-03-26 DIAGNOSIS — I5021 Acute systolic (congestive) heart failure: Secondary | ICD-10-CM | POA: Diagnosis present

## 2018-03-26 DIAGNOSIS — I4891 Unspecified atrial fibrillation: Secondary | ICD-10-CM | POA: Diagnosis present

## 2018-03-26 DIAGNOSIS — J810 Acute pulmonary edema: Secondary | ICD-10-CM

## 2018-03-26 DIAGNOSIS — J9621 Acute and chronic respiratory failure with hypoxia: Secondary | ICD-10-CM

## 2018-03-26 DIAGNOSIS — J849 Interstitial pulmonary disease, unspecified: Secondary | ICD-10-CM

## 2018-03-26 LAB — BASIC METABOLIC PANEL
ANION GAP: 9 (ref 5–15)
BUN: 30 mg/dL — AB (ref 8–23)
CALCIUM: 8.5 mg/dL — AB (ref 8.9–10.3)
CO2: 27 mmol/L (ref 22–32)
CREATININE: 0.93 mg/dL (ref 0.61–1.24)
Chloride: 99 mmol/L (ref 98–111)
GFR calc Af Amer: >60 mL/min (ref >60)
GLUCOSE: 159 mg/dL — AB (ref 70–99)
Potassium: 4.6 mmol/L (ref 3.5–5.1)
Sodium: 135 mmol/L (ref 135–145)

## 2018-03-26 LAB — CBC
HEMATOCRIT: 41 % (ref 39.0–52.0)
HEMOGLOBIN: 13.5 g/dL (ref 13.0–17.0)
MCH: 32.1 pg (ref 26.0–34.0)
MCHC: 32.9 g/dL (ref 30.0–36.0)
MCV: 97.4 fL (ref 78.0–100.0)
Platelets: 291 10*3/uL (ref 150–400)
RBC: 4.21 MIL/uL — ABNORMAL LOW (ref 4.22–5.81)
RDW: 14.9 % (ref 11.5–15.5)
WBC: 13.8 10*3/uL — ABNORMAL HIGH (ref 4.0–10.5)

## 2018-03-26 LAB — MAGNESIUM: Magnesium: 2.3 mg/dL (ref 1.7–2.4)

## 2018-03-26 LAB — GLUCOSE, CAPILLARY
Glucose-Capillary: 177 mg/dL — ABNORMAL HIGH (ref 70–99)
Glucose-Capillary: 149 mg/dL — ABNORMAL HIGH (ref 70–99)
Glucose-Capillary: 173 mg/dL — ABNORMAL HIGH (ref 70–99)
Glucose-Capillary: 164 mg/dL — ABNORMAL HIGH (ref 70–99)

## 2018-03-26 LAB — HEPARIN LEVEL (UNFRACTIONATED): Heparin Unfractionated: 0.35 IU/mL (ref 0.30–0.70)

## 2018-03-26 LAB — BRAIN NATRIURETIC PEPTIDE: B NATRIURETIC PEPTIDE 5: 374.1 pg/mL — AB (ref 0.0–100.0)

## 2018-03-26 LAB — SEDIMENTATION RATE: Sed Rate: 65 mm/hr — ABNORMAL HIGH (ref 0–16)

## 2018-03-26 MED ORDER — DILTIAZEM HCL 30 MG PO TABS
30.0000 mg | ORAL_TABLET | Freq: Three times a day (TID) | ORAL | Status: DC
  Administered 2018-03-26: 30 mg via ORAL
  Filled 2018-03-26: qty 1

## 2018-03-26 MED ORDER — LEVALBUTEROL HCL 0.63 MG/3ML IN NEBU
0.6300 mg | INHALATION_SOLUTION | RESPIRATORY_TRACT | Status: DC | PRN
Start: 2018-03-26 — End: 2018-03-31
  Administered 2018-03-27: 0.63 mg via RESPIRATORY_TRACT
  Filled 2018-03-26: qty 3

## 2018-03-26 MED ORDER — FUROSEMIDE 10 MG/ML IJ SOLN
40.0000 mg | Freq: Three times a day (TID) | INTRAMUSCULAR | Status: AC
  Administered 2018-03-26 (×2): 40 mg via INTRAVENOUS
  Filled 2018-03-26 (×2): qty 4

## 2018-03-26 MED ORDER — IPRATROPIUM BROMIDE 0.02 % IN SOLN
0.5000 mg | RESPIRATORY_TRACT | Status: DC | PRN
  Administered 2018-03-27: 0.5 mg via RESPIRATORY_TRACT
  Filled 2018-03-26: qty 2.5

## 2018-03-26 MED ORDER — ORAL CARE MOUTH RINSE
15.0000 mL | Freq: Two times a day (BID) | OROMUCOSAL | Status: DC
  Administered 2018-03-26 – 2018-03-31 (×8): 15 mL via OROMUCOSAL

## 2018-03-26 MED ORDER — APIXABAN 5 MG PO TABS
5.0000 mg | ORAL_TABLET | Freq: Two times a day (BID) | ORAL | Status: DC
  Administered 2018-03-26 – 2018-03-28 (×5): 5 mg via ORAL
  Filled 2018-03-26 (×5): qty 1

## 2018-03-26 MED ORDER — DILTIAZEM HCL 60 MG PO TABS
90.0000 mg | ORAL_TABLET | Freq: Four times a day (QID) | ORAL | Status: DC
  Administered 2018-03-26 – 2018-03-27 (×7): 90 mg via ORAL
  Filled 2018-03-26 (×7): qty 1

## 2018-03-26 NOTE — Discharge Instructions (Signed)

## 2018-03-26 NOTE — Progress Notes (Signed)
PULMONARY / CRITICAL CARE MEDICINE   Name: Omar Mitchell MRN: [id number] DOB: Jul 04, 1936    ADMISSION DATE:  03/20/2018  CHIEF COMPLAINT:  Short of breath  HISTORY OF PRESENT ILLNESS:   82 yo male never smoker admitted from pulmonary with progressive dyspnea.  Has hx of idiopathic pulmonary fibrosis.  Noted to have pneumonitis on CT chest and A fib with RVR.  PAST MEDICAL HISTORY :  Anxiety, dysrhythmia, allergies  SUBJECTIVE:  No events overnight, no new complaints, increase O2 demand overnight, diuresed 1L negative over 24 hours  VITAL SIGNS: BP (!) 112/95   Pulse 65   Temp 97.6 F (36.4 C) (Oral)   Resp (!) 35   Ht 6' 1"  (1.854 m)   Wt 219 lb 2.2 oz (99.4 kg)   SpO2 (!) 88%   BMI 28.91 kg/m   INTAKE / OUTPUT: I/O last 3 completed shifts: In: 935.1 [P.O.:240; I.V.:695.1] Out: 2600 [Urine:2600]  PHYSICAL EXAMINATION:  General - Well appearing, NAD Eyes - PERRL, EOM-I ENT - -JVD and -LAN Cardiac - IRIR, Nl S1/S2 and -M/R/G Chest - Bibasilar crackles Abd - Soft, NT, ND and +BS Ext - -edema and -tenderness Skin - Intact Neuro - normal strength Psych - normal mood   LABS:  BMET Recent Labs  Lab 03/15/2018 1713 03/25/18 0501 03/26/18 0536  NA 136 136 135  K 4.1 4.4 4.6  CL 97* 100 99  CO2 28 27 27   BUN 15 14 30*  CREATININE 0.84 0.79 0.93  GLUCOSE 131* 187* 159*    Electrolytes Recent Labs  Lab 03/26/2018 1713 03/25/18 0501 03/26/18 0536  CALCIUM 8.4* 8.5* 8.5*  MG 1.9 2.1 2.3  PHOS 2.6 3.1  --     CBC Recent Labs  Lab 02/28/2018 1713 03/25/18 0501 03/26/18 0536  WBC 11.6* 8.0 13.8*  HGB 14.0 13.4 13.5  HCT 42.4 40.9 41.0  PLT 258 245 291    Coag's No results for input(s): APTT, INR in the last 168 hours.  Sepsis Markers Recent Labs  Lab 03/05/2018 1713 03/20/2018 1950  LATICACIDVEN 2.6* 2.4*  PROCALCITON <0.10  --     ABG Recent Labs  Lab 02/27/2018 1615  PHART 7.498*  PCO2ART 33.5  PO2ART 67.8*    Liver Enzymes Recent Labs   Lab 03/02/2018 1713  AST 28  ALT 22  ALKPHOS 90  BILITOT 1.2  ALBUMIN 2.8*    Cardiac Enzymes Recent Labs  Lab 03/02/2018 1713 03/06/2018 2220 03/25/18 0501  TROPONINI <0.03 <0.03 <0.03    Glucose Recent Labs  Lab 03/25/18 1103 03/25/18 1657 03/25/18 2148 03/26/18 0743  GLUCAP 196* 160* 167* 177*    Imaging Dg Chest Port 1 View  Result Date: 03/26/2018 CLINICAL DATA:  Atrial fibrillation. Shortness of breath. Respiratory failure. EXAM: PORTABLE CHEST 1 VIEW COMPARISON:  Chest CTA dated 03/25/2018. FINDINGS: Normal sized heart. Extensive patchy opacity in both lungs. No visible pleural fluid. No acute bony abnormality. IMPRESSION: Extensive patchy opacity in both lungs with an appearance most compatible with multilobar pneumonia. Electronically Signed   By: Claudie Revering M.D.   On: 03/26/2018 07:21     STUDIES:  Serology 01/26/18 >> HP panel, RNP, aldolase, ANCA, Scl 70, SSA/SSB, RF, anti CCP, ANA, anti DS dna, ACE all negative PFT 02/01/18 >> FEV1 1.88 (59%), FEV1% 85, DLCO 40% CT angio chest 03/25/18 >> mild coronary atherosclerosis, enlarged PA, peripheral and basilar predominant fibrosis and honeycombing with areas of GGO Echo 6/28 >>   CULTURES: Blood 6/27 >>  Respiratory viral panel 6/27 >> negative  ANTIBIOTICS:  SIGNIFICANT EVENTS: 6/27 Admit from pulmonary office, start steroids 6/28 start heparin gtt  LINES/TUBES:  I reviewed CXR myself, scaring noted  DISCUSSION: He has hx of IPF.  He has progressive dyspnea with hypoxia.  Found to have new finding of GGO on CT chest, and has developed A fib with RVR.  Negative procalcitonin and negative respiratory viral panel speak against infectious process.  Differential is either acute pneumonitis in setting of pulmonary fibrosis and/or acute pulmonary edema with A fib and RVR.  ASSESSMENT / PLAN:  Acute hypoxic respiratory failure. - Titrate O2 for sat of 88-92%  Acute pneumonitis with hx of IPF. - Continue  solumedrol - ESR pending - CXR to PRN at this point - BD as ordered  A fib with RVR >> CHA2DS2-Vasc score 2. Acute pulmonary edema. - Dilt drip for rate control - Add PO dilt - Heparin drip per pharmacy - Two more doses of lasix as ordered - BMET in AM - Echo ordered - BNP elevated - Replace K as needed  Steroid induced hyperglycemia. - CBG - SSI  Allergic rhinitis. - Flonase, nasal saline spray  Discussed with bedside RN  Rush Farmer, M.D. [facility] Pulmonary/Critical Care Medicine. Pager: [phone]. After hours pager: [phone].  03/26/2018, 8:35 AM

## 2018-03-26 NOTE — Progress Notes (Addendum)
ANTICOAGULATION CONSULT NOTE  Pharmacy Consult for heparin to apixaban Indication: atrial fibrillation  Allergies  Allergen Reactions  . Neosporin [Neomycin-Polymyxin-Gramicidin] Other (See Comments)    Blisters     Patient Measurements: Height: 6\' 1"  (185.4 cm) Weight: 219 lb 2.2 oz (99.4 kg) IBW/kg (Calculated) : 79.9 Heparin Dosing Weight: 93 kg  Vital Signs: Temp: 97.7 F (36.5 C) (06/29 0428) Temp Source: Oral (06/29 0428) BP: 116/58 (06/29 0600) Pulse Rate: 38 (06/29 0600)  Labs: Recent Labs    05-10-2018 1713 05-10-2018 2220 03/25/18 0501 03/25/18 1857 03/26/18 0536  HGB 14.0  --  13.4  --  13.5  HCT 42.4  --  40.9  --  41.0  PLT 258  --  245  --  291  HEPARINUNFRC  --   --   --  0.44 0.35  CREATININE 0.84  --  0.79  --  0.93  TROPONINI <0.03 <0.03 <0.03  --   --     Estimated Creatinine Clearance: 78.6 mL/min (by C-G formula based on SCr of 0.93 mg/dL).   Medical History: Past Medical History:  Diagnosis Date  . Anxiety   . Dysrhythmia   . Pulmonary fibrosis (HCC)     Assessment: 82 YOM presents with shortness of breath, has h/o IPF.  Patient found to be in atrial fibrillation with RVR (CHA2DS2-Vasc = 3). Pharmacy asked to dose heparin gtt.   Today, 03/26/2018  Heparin level remains therapeutic on 1300 units/hr  CBC: hgb and pltc WNL  Renal fx WNL   Goal of Therapy:  Heparin level 0.3-0.7 units/ml Monitor platelets by anticoagulation protocol: Yes   Plan:   Heparin 1300 units/hr  Daily heparin level and CBC  Await plans for long-term anticoagulation, cardiology recommends apixaban  Monitor for bleeding  Juliette Alcideustin Zeigler, PharmD, BCPS.   Pager: [phone] 03/26/2018 7:06 AM   Addendum Orders to change Heparin to Apixaban  Plan  apixaban 5mg  PO BID (only meets 1 of 3 criteria for dose reduction)  Stop heparin gtt when give 1st dose  Pharmacist to provide education and coupon for 33mo supply free prior to discharge  completed  6/29  Monitor closely for bleeding as Ofev has bleeding risk of its own  Juliette Alcideustin Zeigler, PharmD, BCPS.   Pager: [phone] 03/26/2018 10:44 AM

## 2018-03-26 NOTE — Progress Notes (Addendum)
DAILY PROGRESS NOTE   Patient Name: Omar Mitchell Date of Encounter: 03/26/2018  Chief Complaint   No complaints  Patient Profile   Omar Mitchell is a 82 y.o. male with a hx of interstitial lung disease/idiopathic pulmonary fibrosis who is being seen today for the evaluation of new onset atrial fibrillation and heart failure at the request of Dr. Halford Chessman.  Subjective   HR variable on diltiazem gtts and heparin gtts. No complaints today. Breathing is better. Diuresed 1.6L negative.  Objective   Vitals:   03/26/18 0858 03/26/18 0900 03/26/18 0902 03/26/18 0930  BP:  (!) 91/47  110/63  Pulse: 67 (!) 35 (!) 48 (!) 28  Resp: (!) 33 (!) 34 (!) 35 (!) 27  Temp:      TempSrc:      SpO2: 90% 91% (!) 89% 90%  Weight:      Height:        Intake/Output Summary (Last 24 hours) at 03/26/2018 1024 Last data filed at 03/26/2018 0900 Gross per 24 hour  Intake 1092.75 ml  Output 2000 ml  Net -907.25 ml   Filed Weights   03/23/2018 1640 03/26/18 0434  Weight: 206 lb (93.4 kg) 219 lb 2.2 oz (99.4 kg)    Physical Exam   General appearance: alert and no distress Neck: no carotid bruit, no JVD and thyroid not enlarged, symmetric, no tenderness/mass/nodules Lungs: diminished breath sounds bilaterally and rales bilaterally Heart: irregularly irregular rhythm Abdomen: soft, non-tender; bowel sounds normal; no masses,  no organomegaly Extremities: extremities normal, atraumatic, no cyanosis or edema Pulses: 2+ and symmetric Skin: pale, warm, dry Neurologic: Mental status: Alert, oriented, thought content appropriate Psych: Pleasant  Inpatient Medications    Scheduled Meds: . diltiazem  30 mg Oral Q8H  . famotidine  20 mg Oral BID  . fluticasone  1 spray Each Nare Daily  . furosemide  40 mg Intravenous Q8H  . insulin aspart  0-15 Units Subcutaneous TID WC  . insulin aspart  0-5 Units Subcutaneous QHS  . mouth rinse  15 mL Mouth Rinse BID  . methylPREDNISolone (SOLU-MEDROL) injection   80 mg Intravenous Q6H  . metoprolol tartrate  25 mg Oral BID  . sodium chloride  1 spray Each Nare BID    Continuous Infusions: . sodium chloride 10 mL/hr at 03/26/18 0600  . diltiazem (CARDIZEM) infusion 15 mg/hr (03/26/18 0721)  . heparin 1,300 Units/hr (03/26/18 0600)    PRN Meds: sodium chloride, ALPRAZolam, guaiFENesin-dextromethorphan, ipratropium, levalbuterol   Labs   Results for orders placed or performed during the hospital encounter of 03/19/2018 (from the past 48 hour(s))  Blood gas, arterial     Status: Abnormal (Preliminary result)   Collection Time: 03/23/2018  4:15 PM  Result Value Ref Range   O2 Content 3.0 L/min   pH, Arterial 7.498 (H) 7.350 - 7.450   pCO2 arterial 33.5 32.0 - 48.0 mmHg   pO2, Arterial 67.8 (L) 83.0 - 108.0 mmHg   Bicarbonate 25.8 20.0 - 28.0 mmol/L   O2 Saturation 94.1 %   Patient temperature PENDING    Amplitude PENDING    Map PENDING cmH20   Hertz PENDING    Oxygen index PENDING    Nitric Oxide PENDING    Collection site LEFT RADIAL    Drawn by [id number]    Sample type ARTERIAL DRAW    Allens test (pass/fail) PASS PASS    Comment: Performed at [facility], [place] [address] [place], [place] [address]  Mechanical Rate PENDING   Culture, blood (routine x 2)     Status: None (Preliminary result)   Collection Time: 03/04/2018  4:57 PM  Result Value Ref Range   Specimen Description      BLOOD RIGHT ANTECUBITAL Performed at [facility], [place] [address] [place], [place] [address]    Special Requests      BOTTLES DRAWN AEROBIC ONLY Blood Culture adequate volume Performed at [place] [address] [place], [place] [address]    Culture      NO GROWTH 2 DAYS Performed at [facility], [place] [address], [place], [place] [address]    Report Status PENDING   Comprehensive metabolic panel     Status: Abnormal   Collection Time: 03/21/2018  5:13 PM  Result Value Ref Range    Sodium 136 135 - 145 mmol/L   Potassium 4.1 3.5 - 5.1 mmol/L   Chloride 97 (L) 98 - 111 mmol/L    Comment: Please note change in reference range.   CO2 28 22 - 32 mmol/L   Glucose, Bld 131 (H) 70 - 99 mg/dL    Comment: Please note change in reference range.   BUN 15 8 - 23 mg/dL    Comment: Please note change in reference range.   Creatinine, Ser 0.84 0.61 - 1.24 mg/dL   Calcium 8.4 (L) 8.9 - 10.3 mg/dL   Total Protein 7.1 6.5 - 8.1 g/dL   Albumin 2.8 (L) 3.5 - 5.0 g/dL   AST 28 15 - 41 U/L   ALT 22 0 - 44 U/L    Comment: Please note change in reference range.   Alkaline Phosphatase 90 38 - 126 U/L   Total Bilirubin 1.2 0.3 - 1.2 mg/dL   GFR calc non Af Amer >60 >60 mL/min   GFR calc Af Amer >60 >60 mL/min    Comment: (NOTE) The eGFR has been calculated using the CKD EPI equation. This calculation has not been validated in all clinical situations. eGFR's persistently <60 mL/min signify possible Chronic Kidney Disease.    Anion gap 11 5 - 15    Comment: Performed at [facility], [place] [address] [place], [place] [address]  Magnesium     Status: None   Collection Time: 03/13/2018  5:13 PM  Result Value Ref Range   Magnesium 1.9 1.7 - 2.4 mg/dL    Comment: Performed at [facility], [place] [address], [place], [place] [address]  Phosphorus     Status: None   Collection Time: 03/23/2018  5:13 PM  Result Value Ref Range   Phosphorus 2.6 2.5 - 4.6 mg/dL    Comment: Performed at [facility], [place] [address] [place], [place] [address]  Amylase     Status: None   Collection Time: 03/14/2018  5:13 PM  Result Value Ref Range   Amylase 44 28 - 100 U/L    Comment: Performed at [facility], [place] [address] [place], [place] [address]  Lipase, blood     Status: None   Collection Time: 03/21/2018  5:13 PM  Result Value Ref Range   Lipase 26 11 - 51 U/L    Comment: Performed at [facility], [place]  [address] [place], [place] [address]  Troponin I     Status: None   Collection Time: 03/03/2018  5:13 PM  Result Value Ref Range   Troponin I <0.03 <0.03 ng/mL  Comment: Performed at [facility], [place] [address], [place], [place] [address]  Lactic acid, plasma     Status: Abnormal   Collection Time: 03/16/2018  5:13 PM  Result Value Ref Range   Lactic Acid, Venous 2.6 (HH) 0.5 - 1.9 mmol/L    Comment: CRITICAL RESULT CALLED TO, READ BACK BY AND VERIFIED WITH: R.JOHNSON RN 03/23/2018 1800 JR Performed at [facility], [place] [address], [place], [place] [address]   Procalcitonin     Status: None   Collection Time: 03/13/2018  5:13 PM  Result Value Ref Range   Procalcitonin <0.10 ng/mL    Comment:        Interpretation: PCT (Procalcitonin) <= 0.5 ng/mL: Systemic infection (sepsis) is not likely. Local bacterial infection is possible. (NOTE)       Sepsis PCT Algorithm           Lower Respiratory Tract                                      Infection PCT Algorithm    ----------------------------     ----------------------------         PCT < 0.25 ng/mL                PCT < 0.10 ng/mL         Strongly encourage             Strongly discourage   discontinuation of antibiotics    initiation of antibiotics    ----------------------------     -----------------------------       PCT 0.25 - 0.50 ng/mL            PCT 0.10 - 0.25 ng/mL               OR       >80% decrease in PCT            Discourage initiation of                                            antibiotics      Encourage discontinuation           of antibiotics    ----------------------------     -----------------------------         PCT >= 0.50 ng/mL              PCT 0.26 - 0.50 ng/mL               AND        <80% decrease in PCT             Encourage initiation of                                             antibiotics       Encourage continuation           of antibiotics     ----------------------------     -----------------------------        PCT >= 0.50 ng/mL                  PCT > 0.50 ng/mL  AND         increase in PCT                  Strongly encourage                                      initiation of antibiotics    Strongly encourage escalation           of antibiotics                                     -----------------------------                                           PCT <= 0.25 ng/mL                                                 OR                                        > 80% decrease in PCT                                     Discontinue / Do not initiate                                             antibiotics Performed at [place] [address] [place], [place] [address]   Brain natriuretic peptide     Status: Abnormal   Collection Time: 03/02/2018  5:13 PM  Result Value Ref Range   B Natriuretic Peptide 445.4 (H) 0.0 - 100.0 pg/mL    Comment: Performed at [facility], [place] [address], [place], [place] [address]  CBC WITH DIFFERENTIAL     Status: Abnormal   Collection Time: 03/27/2018  5:13 PM  Result Value Ref Range   WBC 11.6 (H) 4.0 - 10.5 K/uL   RBC 4.37 4.22 - 5.81 MIL/uL   Hemoglobin 14.0 13.0 - 17.0 g/dL   HCT 42.4 39.0 - 52.0 %   MCV 97.0 78.0 - 100.0 fL   MCH 32.0 26.0 - 34.0 pg   MCHC 33.0 30.0 - 36.0 g/dL   RDW 14.6 11.5 - 15.5 %   Platelets 258 150 - 400 K/uL   Neutrophils Relative % 82 %   Neutro Abs 9.5 (H) 1.7 - 7.7 K/uL   Lymphocytes Relative 8 %   Lymphs Abs 1.0 0.7 - 4.0 K/uL   Monocytes Relative 10 %   Monocytes Absolute 1.1 (H) 0.1 - 1.0 K/uL   Eosinophils Relative 0 %   Eosinophils Absolute 0.0 0.0 - 0.7 K/uL   Basophils Relative 0 %   Basophils Absolute 0.0 0.0 - 0.1 K/uL    Comment: Performed at [facility],  [place] [address] [place], [place] [address]  Culture, blood (routine x 2)     Status: None (Preliminary result)    Collection Time: 03/16/2018  5:13 PM  Result Value Ref Range   Specimen Description      BLOOD RIGHT FOREARM Performed at [facility], [place] [address], [place], [place] [address]    Special Requests      BOTTLES DRAWN AEROBIC ONLY Blood Culture adequate volume Performed at [place] [address] [place], [place] [address]    Culture      NO GROWTH 2 DAYS Performed at [facility], [place] [address], [place], [place] [address]    Report Status PENDING   Respiratory Panel by PCR     Status: None   Collection Time: 03/05/2018  5:42 PM  Result Value Ref Range   Adenovirus NOT DETECTED NOT DETECTED   Coronavirus 229E NOT DETECTED NOT DETECTED   Coronavirus HKU1 NOT DETECTED NOT DETECTED   Coronavirus NL63 NOT DETECTED NOT DETECTED   Coronavirus OC43 NOT DETECTED NOT DETECTED   Metapneumovirus NOT DETECTED NOT DETECTED   Rhinovirus / Enterovirus NOT DETECTED NOT DETECTED   Influenza A NOT DETECTED NOT DETECTED   Influenza B NOT DETECTED NOT DETECTED   Parainfluenza Virus 1 NOT DETECTED NOT DETECTED   Parainfluenza Virus 2 NOT DETECTED NOT DETECTED   Parainfluenza Virus 3 NOT DETECTED NOT DETECTED   Parainfluenza Virus 4 NOT DETECTED NOT DETECTED   Respiratory Syncytial Virus NOT DETECTED NOT DETECTED   Bordetella pertussis NOT DETECTED NOT DETECTED   Chlamydophila pneumoniae NOT DETECTED NOT DETECTED   Mycoplasma pneumoniae NOT DETECTED NOT DETECTED    Comment: Performed at [place] [address], [place], [place] [address]  MRSA PCR Screening     Status: None   Collection Time: 03/16/2018  5:42 PM  Result Value Ref Range   MRSA by PCR NEGATIVE NEGATIVE    Comment:        The GeneXpert MRSA Assay (FDA approved for NASAL specimens only), is one component of a comprehensive MRSA colonization surveillance program. It is not intended to diagnose MRSA infection nor to guide or monitor treatment for MRSA infections. Performed at [facility], [place] [address] [place], [place] [address]   Lactic acid, plasma     Status: Abnormal   Collection Time: 03/23/2018  7:50 PM  Result Value Ref Range   Lactic Acid, Venous 2.4 (HH) 0.5 - 1.9 mmol/L    Comment: CRITICAL RESULT CALLED TO, READ BACK BY AND VERIFIED WITH: M.FAULK RN 02/27/2018 2029 JR Performed at [facility], [place] [address], [place], [place] [address]   Troponin I     Status: None   Collection Time: 03/10/2018 10:20 PM  Result Value Ref Range   Troponin I <0.03 <0.03 ng/mL    Comment: Performed at [facility], [place] [address] [place], [place] [address]  Troponin I     Status: None   Collection Time: 03/25/18  5:01 AM  Result Value Ref Range   Troponin I <0.03 <0.03 ng/mL    Comment: Performed at [facility], [place] [address], [place], [place] [address]  CBC     Status: Abnormal   Collection Time: 03/25/18  5:01 AM  Result Value Ref Range   WBC 8.0 4.0 - 10.5 K/uL   RBC 4.18 (L) 4.22 - 5.81 MIL/uL   Hemoglobin 13.4 13.0 -  17.0 g/dL   HCT 40.9 39.0 - 52.0 %   MCV 97.8 78.0 - 100.0 fL   MCH 32.1 26.0 - 34.0 pg   MCHC 32.8 30.0 - 36.0 g/dL   RDW 14.7 11.5 - 15.5 %   Platelets 245 150 - 400 K/uL    Comment: Performed at [facility], [place] [address], [place], [place] [address]  Basic metabolic panel     Status: Abnormal   Collection Time: 03/25/18  5:01 AM  Result Value Ref Range   Sodium 136 135 - 145 mmol/L   Potassium 4.4 3.5 - 5.1 mmol/L   Chloride 100 98 - 111 mmol/L    Comment: Please note change in reference range.   CO2 27 22 - 32 mmol/L   Glucose, Bld 187 (H) 70 - 99 mg/dL    Comment: Please note change in reference range.   BUN 14 8 - 23 mg/dL    Comment: Please note change in reference range.   Creatinine, Ser 0.79 0.61 - 1.24 mg/dL   Calcium 8.5 (L) 8.9 - 10.3 mg/dL   GFR calc non Af Amer >60 >60 mL/min   GFR calc Af Amer >60 >60 mL/min    Comment:  (NOTE) The eGFR has been calculated using the CKD EPI equation. This calculation has not been validated in all clinical situations. eGFR's persistently <60 mL/min signify possible Chronic Kidney Disease.    Anion gap 9 5 - 15    Comment: Performed at [facility], [place] [address], [place], [place] [address]  Magnesium     Status: None   Collection Time: 03/25/18  5:01 AM  Result Value Ref Range   Magnesium 2.1 1.7 - 2.4 mg/dL    Comment: Performed at [facility], [place] [address], [place], [place] [address]  Phosphorus     Status: None   Collection Time: 03/25/18  5:01 AM  Result Value Ref Range   Phosphorus 3.1 2.5 - 4.6 mg/dL    Comment: Performed at [facility], [place] [address], [place], [place] [address]  Glucose, capillary     Status: Abnormal   Collection Time: 03/25/18 11:03 AM  Result Value Ref Range   Glucose-Capillary 196 (H) 70 - 99 mg/dL   Comment 1 Notify RN    Comment 2 Document in Chart   Glucose, capillary     Status: Abnormal   Collection Time: 03/25/18  4:57 PM  Result Value Ref Range   Glucose-Capillary 160 (H) 70 - 99 mg/dL   Comment 1 Notify RN    Comment 2 Document in Chart   Heparin level (unfractionated)     Status: None   Collection Time: 03/25/18  6:57 PM  Result Value Ref Range   Heparin Unfractionated 0.44 0.30 - 0.70 IU/mL    Comment: (NOTE) If heparin results are below expected values, and patient dosage has  been confirmed, suggest follow up testing of antithrombin III levels. Performed at [facility], [place] [address], [place], [place] [address]   Glucose, capillary     Status: Abnormal   Collection Time: 03/25/18  9:48 PM  Result Value Ref Range   Glucose-Capillary 167 (H) 70 - 99 mg/dL   Comment 1 Notify RN   Basic metabolic panel     Status: Abnormal   Collection Time: 03/26/18  5:36 AM  Result Value Ref Range   Sodium 135 135 - 145 mmol/L   Potassium 4.6 3.5 -  5.1 mmol/L   Chloride 99 98 - 111 mmol/L    Comment: Please note change in reference range.   CO2 27 22 - 32 mmol/L   Glucose, Bld 159 (H) 70 - 99 mg/dL    Comment: Please note change in reference range.   BUN 30 (H) 8 - 23 mg/dL    Comment: Please note change in reference range.   Creatinine, Ser 0.93 0.61 - 1.24 mg/dL   Calcium 8.5 (L) 8.9 - 10.3 mg/dL   GFR calc non Af Amer >60 >60 mL/min   GFR calc Af Amer >60 >60 mL/min    Comment: (NOTE) The eGFR has been calculated using the CKD EPI equation. This calculation has not been validated in all clinical situations. eGFR's persistently <60 mL/min signify possible Chronic Kidney Disease.    Anion gap 9 5 - 15    Comment: Performed at [facility], [place] [address], [place], [place] [address]  Magnesium     Status: None   Collection Time: 03/26/18  5:36 AM  Result Value Ref Range   Magnesium 2.3 1.7 - 2.4 mg/dL    Comment: Performed at [facility], [place] [address], [place], [place] [address]  Sedimentation rate     Status: Abnormal   Collection Time: 03/26/18  5:36 AM  Result Value Ref Range   Sed Rate 65 (H) 0 - 16 mm/hr    Comment: Performed at [facility], [place] [address] [place], [place] [address]  Brain natriuretic peptide     Status: Abnormal   Collection Time: 03/26/18  5:36 AM  Result Value Ref Range   B Natriuretic Peptide 374.1 (H) 0.0 - 100.0 pg/mL    Comment: Performed at [facility], [place] [address], [place], [place] [address]  CBC     Status: Abnormal   Collection Time: 03/26/18  5:36 AM  Result Value Ref Range   WBC 13.8 (H) 4.0 - 10.5 K/uL   RBC 4.21 (L) 4.22 - 5.81 MIL/uL   Hemoglobin 13.5 13.0 - 17.0 g/dL   HCT 41.0 39.0 - 52.0 %   MCV 97.4 78.0 - 100.0 fL   MCH 32.1 26.0 - 34.0 pg   MCHC 32.9 30.0 - 36.0 g/dL   RDW 14.9 11.5 - 15.5 %   Platelets 291 150 - 400 K/uL    Comment: Performed at [facility], [place]  [address] [place], [place] [address]  Heparin level (unfractionated)     Status: None   Collection Time: 03/26/18  5:36 AM  Result Value Ref Range   Heparin Unfractionated 0.35 0.30 - 0.70 IU/mL    Comment: (NOTE) If heparin results are below expected values, and patient dosage has  been confirmed, suggest follow up testing of antithrombin III levels. Performed at [facility], [place] [address], [place], [place] [address]   Glucose, capillary     Status: Abnormal   Collection Time: 03/26/18  7:43 AM  Result Value Ref Range   Glucose-Capillary 177 (H) 70 - 99 mg/dL    ECG   N/A  Telemetry   A-fib with RVR - Personally Reviewed  Radiology    Ct Angio Chest Pe W Or Wo Contrast  Result Date: 03/25/2018 CLINICAL DATA:  82 y/o M; persistent dry cough worse at night and shortness of breath for 4-6 months. Leukocytosis. History of pulmonary fibrosis. EXAM: CT ANGIOGRAPHY CHEST WITH CONTRAST TECHNIQUE: Multidetector CT imaging of the chest was performed  using the standard protocol during bolus administration of intravenous contrast. Multiplanar CT image reconstructions and MIPs were obtained to evaluate the vascular anatomy. CONTRAST:  136m ISOVUE-370 IOPAMIDOL (ISOVUE-370) INJECTION 76% COMPARISON:  07/05/2017 CT chest. Concurrent 03/25/2018 high-resolution CT of the chest. FINDINGS: Cardiovascular: Mild coronary artery calcific atherosclerosis. Mitral annular calcification. Normal caliber thoracic aorta with mild calcific atherosclerosis. Enlarged main pulmonary artery to 3.7 cm. Satisfactory opacification of the pulmonary arteries. Respiratory motion artifact in the lung bases. No pulmonary embolus identified. Mediastinum/Nodes: Normal thyroid gland. Normal thoracic esophagus. Mild mediastinal, subcarinal, and bilateral hilar lymphadenopathy, likely reactive. Lungs/Pleura: Pulmonary fibrosis with a peripheral and basilar predominance and honeycombing similar to the 2018 CT of  the chest. Interval development of diffuse ground-glass opacities of the lungs with focal areas of lobular sparing. Upper Abdomen: Cholelithiasis. 14 mm left adrenal nodule measuring -27 HU with focal fat, consistent with myelolipoma Musculoskeletal: No chest wall abnormality. No acute or significant osseous findings. Review of the MIP images confirms the above findings. IMPRESSION: 1. Respiratory motion artifact in the lung bases. No pulmonary embolus identified. 2. Pulmonary fibrosis with peripheral and basilar predominance and honeycombing. Please refer to concurrent high-resolution CT of the chest for further characterization. 3. Diffuse ground-glass opacities with lobular areas of sparing, possibly superimposed pulmonary edema or pneumonitis. 4. Cholelithiasis. 5. 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 6. Enlarged main pulmonary artery compatible pulmonary artery hypertension. 7. Mild aortic and coronary artery calcific atherosclerosis. Electronically Signed   By: LKristine GarbeM.D.   On: 03/25/2018 03:35   Ct Chest High Resolution  Result Date: 03/26/2018 CLINICAL DATA:  Persistent dry cough, worse at night. Shortness of breath for the past 4-6 months. EXAM: CT CHEST WITHOUT CONTRAST TECHNIQUE: Multidetector CT imaging of the chest was performed following the standard protocol without intravenous contrast. High resolution imaging of the lungs, as well as inspiratory and expiratory imaging, was performed. COMPARISON:  Chest radiographs dated 09/27/2017. Chest CT dated 07/05/2017. Subsequent chest CTA dated 03/25/2018. FINDINGS: Cardiovascular: Atheromatous calcifications, including the coronary arteries and aorta. Mildly enlarged heart. Mildly enlarged central pulmonary arteries. Mediastinum/Nodes: Enlarged mediastinal and bilateral hilar lymph nodes. These are better demonstrated on the subsequent chest CTA. Normal appearing thyroid gland. Lungs/Pleura: Extensive patchy ground-glass opacity in both  lungs with peripheral honeycombing. No pleural fluid. Upper Abdomen: 2.4 cm gallstone in the gallbladder. No gallbladder wall thickening or pericholecystic fluid. Musculoskeletal: Thoracic spine degenerative changes. IMPRESSION: 1. Extensive patchy ground-glass opacities in both lungs compatible with active infection/inflammation. 2. Interstitial fibrosis with extensive peripheral honeycombing in both lungs. 3. Cholelithiasis. 4. Mild cardiomegaly and evidence of pulmonary arterial hypertension. 5.  Calcific coronary artery and aortic atherosclerosis. 6. Mediastinal and bilateral hilar adenopathy, most likely reactive. Aortic Atherosclerosis (ICD10-I70.0). Electronically Signed   By: SClaudie ReveringM.D.   On: 03/26/2018 07:30   Dg Chest Port 1 View  Result Date: 03/26/2018 CLINICAL DATA:  Atrial fibrillation. Shortness of breath. Respiratory failure. EXAM: PORTABLE CHEST 1 VIEW COMPARISON:  Chest CTA dated 03/25/2018. FINDINGS: Normal sized heart. Extensive patchy opacity in both lungs. No visible pleural fluid. No acute bony abnormality. IMPRESSION: Extensive patchy opacity in both lungs with an appearance most compatible with multilobar pneumonia. Electronically Signed   By: SClaudie ReveringM.D.   On: 03/26/2018 07:21    Cardiac Studies   LV EF: 40% -   45%  ------------------------------------------------------------------- Indications:      Atrial fibrillation - 427.31.  ------------------------------------------------------------------- History:   PMH:  dysrhythmia.  ------------------------------------------------------------------- Study Conclusions  -  Left ventricle: The cavity size was normal. Wall thickness was   normal. Systolic function was mildly to moderately reduced. The   estimated ejection fraction was in the range of 40% to 45%.   Moderate hypokinesis of the mid-apicalanteroseptal myocardium. - Mitral valve: There was mild regurgitation. - Left atrium: The atrium was mildly  dilated. - Right ventricle: The cavity size was mildly dilated. Systolic   function was mildly reduced. - Right atrium: The atrium was mildly dilated.  Assessment   Principal Problem:   Acute on chronic respiratory failure (HCC) Active Problems:   Acute and chronic respiratory failure (acute-on-chronic) (HCC)   Atrial fibrillation with RVR (HCC)   Acute systolic (congestive) heart failure ([place])   Plan   1. Rate control is variable - agree with switch to oral cardizem, however, currently he is on 15 mg/h (~360 mg daily) - will need to be on a higher dose than 30 mg q8 hrs (90 mg daily) - I will adjust. Not planning TEE/DCCV or interventions at this point - would transition heparin to Eliquis. Continue diuresis.  Time Spent Directly with Patient:  I have spent a total of 35 minutes with the patient reviewing hospital notes, telemetry, EKGs, labs and examining the patient as well as establishing an assessment and plan that was discussed personally with the patient.  > 50% of time was spent in direct patient care.  Length of Stay:  LOS: 2 days   Pixie Casino, MD, [facility], [place] Director of the Advanced Lipid Disorders &  Cardiovascular Risk Reduction Clinic Diplomate of the American Board of Clinical Lipidology Attending Cardiologist  Direct Dial: [phone]  Fax: [phone]  Website:  [email or web address] 03/26/2018, 10:24 AM

## 2018-03-26 NOTE — Progress Notes (Signed)
Called by RN to patient's room. Patient had tried to get up and got SOB. RN increased 02 to 10L salter HFNC. Xopenex given. RT will continue to monitor.

## 2018-03-27 ENCOUNTER — Inpatient Hospital Stay (HOSPITAL_COMMUNITY): Payer: Medicare Other

## 2018-03-27 LAB — CBC
HCT: 41.6 % (ref 39.0–52.0)
HEMOGLOBIN: 13.3 g/dL (ref 13.0–17.0)
MCH: 31.2 pg (ref 26.0–34.0)
MCHC: 32 g/dL (ref 30.0–36.0)
MCV: 97.7 fL (ref 78.0–100.0)
PLATELETS: 290 10*3/uL (ref 150–400)
RBC: 4.26 MIL/uL (ref 4.22–5.81)
RDW: 14.8 % (ref 11.5–15.5)
WBC: 13.1 10*3/uL — AB (ref 4.0–10.5)

## 2018-03-27 LAB — BLOOD GAS, ARTERIAL
Acid-Base Excess: 3.8 mmol/L — ABNORMAL HIGH (ref 0.0–2.0)
BICARBONATE: 28.8 mmol/L — AB (ref 20.0–28.0)
Drawn by: 232811
O2 CONTENT: 15 L/min
O2 SAT: 96 %
PATIENT TEMPERATURE: 97.5
PO2 ART: 85 mmHg (ref 83.0–108.0)
pCO2 arterial: 45.3 mmHg (ref 32.0–48.0)
pH, Arterial: 7.415 (ref 7.350–7.450)

## 2018-03-27 LAB — PHOSPHORUS: Phosphorus: 4.4 mg/dL (ref 2.5–4.6)

## 2018-03-27 LAB — BASIC METABOLIC PANEL
ANION GAP: 8 (ref 5–15)
BUN: 37 mg/dL — ABNORMAL HIGH (ref 8–23)
CHLORIDE: 98 mmol/L (ref 98–111)
CO2: 31 mmol/L (ref 22–32)
Calcium: 8.6 mg/dL — ABNORMAL LOW (ref 8.9–10.3)
Creatinine, Ser: 1.05 mg/dL (ref 0.61–1.24)
GFR calc Af Amer: >60 mL/min (ref >60)
Glucose, Bld: 163 mg/dL — ABNORMAL HIGH (ref 70–99)
POTASSIUM: 4.9 mmol/L (ref 3.5–5.1)
SODIUM: 137 mmol/L (ref 135–145)

## 2018-03-27 LAB — GLUCOSE, CAPILLARY
GLUCOSE-CAPILLARY: 152 mg/dL — AB (ref 70–99)
Glucose-Capillary: 166 mg/dL — ABNORMAL HIGH (ref 70–99)
Glucose-Capillary: 153 mg/dL — ABNORMAL HIGH (ref 70–99)
Glucose-Capillary: 164 mg/dL — ABNORMAL HIGH (ref 70–99)

## 2018-03-27 LAB — MAGNESIUM: MAGNESIUM: 2.4 mg/dL (ref 1.7–2.4)

## 2018-03-27 MED ORDER — METOPROLOL SUCCINATE ER 25 MG PO TB24
50.0000 mg | ORAL_TABLET | Freq: Every day | ORAL | Status: DC
  Administered 2018-03-27: 50 mg via ORAL
  Filled 2018-03-27: qty 2

## 2018-03-27 MED ORDER — FUROSEMIDE 10 MG/ML IJ SOLN
40.0000 mg | Freq: Three times a day (TID) | INTRAMUSCULAR | Status: AC
  Administered 2018-03-27 (×2): 40 mg via INTRAVENOUS
  Filled 2018-03-27 (×2): qty 4

## 2018-03-27 MED ORDER — DILTIAZEM HCL ER COATED BEADS 180 MG PO CP24: 300.0000 mg | ORAL_CAPSULE | Freq: Every day | ORAL | Status: DC

## 2018-03-27 NOTE — Progress Notes (Signed)
PULMONARY / CRITICAL CARE MEDICINE   Name: Omar Mitchell MRN: [id number] DOB: Jan 29, 1936    ADMISSION DATE:  Mar 28, 2018  CHIEF COMPLAINT:  Short of breath  HISTORY OF PRESENT ILLNESS:   82 yo male never smoker admitted from pulmonary with progressive dyspnea.  Has hx of idiopathic pulmonary fibrosis.  Noted to have pneumonitis on CT chest and A fib with RVR.  PAST MEDICAL HISTORY :  Anxiety, dysrhythmia, allergies  SUBJECTIVE:  Some confusion overnight and required 100% NRB but now back to HFNC without complaints   VITAL SIGNS: BP (!) 129/91 (BP Location: Right Arm)   Pulse (!) 113   Temp 97.7 F (36.5 C) (Axillary)   Resp 20   Ht 6\' 1"  (1.854 m)   Wt 219 lb 5.7 oz (99.5 kg)   SpO2 93%   BMI 28.94 kg/m   INTAKE / OUTPUT: I/O last 3 completed shifts: In: 1705.9 [P.O.:960; I.V.:745.9] Out: 2560 [Urine:2560]  PHYSICAL EXAMINATION:  General - Well appearing, NAD Eyes - PERRL, EOM-I ENT - -JVD and -LAN Cardiac - IRIR, Nl S1/S2 and -M/R/G Chest - Diffuse dry crackles Abd - Soft, NT, ND and +BS Ext - -edema and -tenderness Skin - Intact Neuro - normal strength  LABS:  BMET Recent Labs  Lab 03/25/18 0501 03/26/18 0536 03/27/18 0316  NA 136 135 137  K 4.4 4.6 4.9  CL 100 99 98  CO2 27 27 31   BUN 14 30* 37*  CREATININE 0.79 0.93 1.05  GLUCOSE 187* 159* 163*   Electrolytes Recent Labs  Lab 28-Mar-2018 1713 03/25/18 0501 03/26/18 0536 03/27/18 0316  CALCIUM 8.4* 8.5* 8.5* 8.6*  MG 1.9 2.1 2.3 2.4  PHOS 2.6 3.1  --  4.4   CBC Recent Labs  Lab 03/25/18 0501 03/26/18 0536 03/27/18 0316  WBC 8.0 13.8* 13.1*  HGB 13.4 13.5 13.3  HCT 40.9 41.0 41.6  PLT 245 291 290   Coag's No results for input(s): APTT, INR in the last 168 hours.  Sepsis Markers Recent Labs  Lab 2018-03-28 1713 Mar 28, 2018 1950  LATICACIDVEN 2.6* 2.4*  PROCALCITON <0.10  --    ABG Recent Labs  Lab Mar 28, 2018 1615 03/27/18 0332  PHART 7.498* 7.415  PCO2ART 33.5 45.3  PO2ART  67.8* 85.0   Liver Enzymes Recent Labs  Lab 28-Mar-2018 1713  AST 28  ALT 22  ALKPHOS 90  BILITOT 1.2  ALBUMIN 2.8*   Cardiac Enzymes Recent Labs  Lab 03/28/2018 1713 2018-03-28 2220 03/25/18 0501  TROPONINI <0.03 <0.03 <0.03   Glucose Recent Labs  Lab 03/25/18 2148 03/26/18 0743 03/26/18 1214 03/26/18 1706 03/26/18 2136 03/27/18 0730  GLUCAP 167* 177* 149* 173* 164* 166*   Imaging Dg Chest Port 1 View  Result Date: 03/27/2018 CLINICAL DATA:  History of ET tube EXAM: PORTABLE CHEST 1 VIEW COMPARISON:  Chest radiograph 03/26/2018 FINDINGS: Monitoring leads overlie the patient. Stable cardiac and mediastinal contours. Interval worsening of diffuse bilateral airspace opacities. Small bilateral pleural effusions. No pneumothorax. IMPRESSION: Interval worsening of diffuse bilateral consolidative opacities raising the possibility of multifocal pneumonia. Electronically Signed   By: Annia Belt M.D.   On: 03/27/2018 07:53   STUDIES:  Serology 01/26/18 >> HP panel, RNP, aldolase, ANCA, Scl 70, SSA/SSB, RF, anti CCP, ANA, anti DS dna, ACE all negative PFT 02/01/18 >> FEV1 1.88 (59%), FEV1% 85, DLCO 40% CT angio chest 03/25/18 >> mild coronary atherosclerosis, enlarged PA, peripheral and basilar predominant fibrosis and honeycombing with areas of GGO Echo 6/28 >>  CULTURES: Blood 6/27 >>  Respiratory viral panel 6/27 >> negative  ANTIBIOTICS:  SIGNIFICANT EVENTS: 6/27 Admit from pulmonary office, start steroids 6/28 start heparin gtt  LINES/TUBES: I reviewed CXR myself, infiltrate and diffuse scarring noted.  DISCUSSION: He has hx of IPF.  He has progressive dyspnea with hypoxia.  Found to have new finding of GGO on CT chest, and has developed A fib with RVR.  Negative procalcitonin and negative respiratory viral panel speak against infectious process.  Differential is either acute pneumonitis in setting of pulmonary fibrosis and/or acute pulmonary edema with A fib and  RVR.  ASSESSMENT / PLAN:  Acute hypoxic respiratory failure. - Titrate O2 for sat of 88-92% - Treat PNA, ILD and pulmonary edema - May need to discuss plan of care, patient is not improving  Acute pneumonitis with hx of IPF. - Continue solumedrol for now - CXR to PRN at this point - BD as ordered  A fib with RVR >> CHA2DS2-Vasc score 2. Acute pulmonary edema. - Dilt drip off with the addition of PO dilt - Continue PO dilt - Heparin drip per pharmacy - Lasix 40 mg IV q8 x2 doses - BMET in AM - Echo ordered, defer to cards - BNP elevated - Replace K as needed  Steroid induced hyperglycemia. - CBG - SSI  Allergic rhinitis. - Flonase, nasal saline spray  Discussed with bedside RN  Alyson ReedyWesam G. Yacoub, M.D. [facility]. Pager: [phone]. After hours pager: [phone].  03/27/2018, 8:51 AM

## 2018-03-27 NOTE — Progress Notes (Signed)
Pt was sitting up in bed, awake and alert when I arrived. He was interested in updating his HCPOA. I explained that we would have someone follow-up during the day tomorrow in order for it to be complete with a notary. I also explained the difference between [facility] and a living will. [place] [place]. Pt was given a copy of the directives to review prior to his visit and completion tomorrow. Pt also requested prayer. Pt was very appreciative of visit, information and prayer. Please page if additional assistance is needed. Chaplain Elmarie Shileyamela Mialynn Shelvin E[place]Holder, [place]   03/27/18 2100  Clinical Encounter Type  Visited With Patient

## 2018-03-27 NOTE — Progress Notes (Signed)
Pt increasingly confused. Pt pulling at lines and oxygen tubing. Pt has bed alarm on and high fall risk safety mats in place. Bed in lowest setting and call bell in reach.  Provider notified. RN will continue to monitor.

## 2018-03-27 NOTE — Progress Notes (Signed)
DAILY PROGRESS NOTE   Patient Name: Omar Mitchell Date of Encounter: 03/27/2018  Chief Complaint   No complaints  Patient Profile   Omar Mitchell is a 82 y.o. male with a hx of interstitial lung disease/idiopathic pulmonary fibrosis who is being seen today for the evaluation of new onset atrial fibrillation and heart failure at the request of Dr. Halford Chessman.  Subjective   Rate control improved today - now around 50-60's - BP soft. Diuresing - net negative 1.1L overnight, overall 2.8L. Seems that he is breathing somewhat better today. Echo showed LVEF 40-45% with moderate hypokinesis of the mid apical and anteroseptal myocardium.  Objective   Vitals:   03/27/18 0223 03/27/18 0400 03/27/18 0800 03/27/18 0952  BP:  92/74 (!) 129/91   Pulse:  (!) 50 (!) 113 (!) 40  Resp:  (!) 24 20 (!) 32  Temp:  (!) 97.4 F (36.3 C) 97.7 F (36.5 C)   TempSrc:  Oral Axillary   SpO2: 90% 92% 93% 91%  Weight:  219 lb 5.7 oz (99.5 kg)    Height:        Intake/Output Summary (Last 24 hours) at 03/27/2018 1039 Last data filed at 03/27/2018 0800 Gross per 24 hour  Intake 613.17 ml  Output 2085 ml  Net -1471.83 ml   Filed Weights   03/05/2018 1640 03/26/18 0434 03/27/18 0400  Weight: 206 lb (93.4 kg) 219 lb 2.2 oz (99.4 kg) 219 lb 5.7 oz (99.5 kg)    Physical Exam   General appearance: alert and no distress Neck: no carotid bruit, no JVD and thyroid not enlarged, symmetric, no tenderness/mass/nodules Lungs: diminished breath sounds bilaterally and rales bilaterally Heart: irregularly irregular rhythm and bradycardic Abdomen: soft, non-tender; bowel sounds normal; no masses,  no organomegaly Extremities: extremities normal, atraumatic, no cyanosis or edema Pulses: 2+ and symmetric Skin: pale, warm, dry Neurologic: Mental status: Alert, oriented, thought content appropriate Psych: Pleasant  Inpatient Medications    Scheduled Meds: . apixaban  5 mg Oral BID  . diltiazem  90 mg Oral Q6H  .  famotidine  20 mg Oral BID  . fluticasone  1 spray Each Nare Daily  . furosemide  40 mg Intravenous Q8H  . insulin aspart  0-15 Units Subcutaneous TID WC  . insulin aspart  0-5 Units Subcutaneous QHS  . mouth rinse  15 mL Mouth Rinse BID  . methylPREDNISolone (SOLU-MEDROL) injection  80 mg Intravenous Q6H  . metoprolol tartrate  25 mg Oral BID  . sodium chloride  1 spray Each Nare BID    Continuous Infusions: . sodium chloride Stopped (03/26/18 1330)    PRN Meds: sodium chloride, ALPRAZolam, guaiFENesin-dextromethorphan, ipratropium, levalbuterol   Labs   Results for orders placed or performed during the hospital encounter of 03/14/2018 (from the past 48 hour(s))  Glucose, capillary     Status: Abnormal   Collection Time: 03/25/18 11:03 AM  Result Value Ref Range   Glucose-Capillary 196 (H) 70 - 99 mg/dL   Comment 1 Notify RN    Comment 2 Document in Chart   Glucose, capillary     Status: Abnormal   Collection Time: 03/25/18  4:57 PM  Result Value Ref Range   Glucose-Capillary 160 (H) 70 - 99 mg/dL   Comment 1 Notify RN    Comment 2 Document in Chart   Heparin level (unfractionated)     Status: None   Collection Time: 03/25/18  6:57 PM  Result Value Ref Range   Heparin Unfractionated 0.44  0.30 - 0.70 IU/mL    Comment: (NOTE) If heparin results are below expected values, and patient dosage has  been confirmed, suggest follow up testing of antithrombin III levels. Performed at [facility], [place] [address] [place], [place] [address]   Glucose, capillary     Status: Abnormal   Collection Time: 03/25/18  9:48 PM  Result Value Ref Range   Glucose-Capillary 167 (H) 70 - 99 mg/dL   Comment 1 Notify RN   Basic metabolic panel     Status: Abnormal   Collection Time: 03/26/18  5:36 AM  Result Value Ref Range   Sodium 135 135 - 145 mmol/L   Potassium 4.6 3.5 - 5.1 mmol/L   Chloride 99 98 - 111 mmol/L    Comment: Please note change in reference range.   CO2  27 22 - 32 mmol/L   Glucose, Bld 159 (H) 70 - 99 mg/dL    Comment: Please note change in reference range.   BUN 30 (H) 8 - 23 mg/dL    Comment: Please note change in reference range.   Creatinine, Ser 0.93 0.61 - 1.24 mg/dL   Calcium 8.5 (L) 8.9 - 10.3 mg/dL   GFR calc non Af Amer >60 >60 mL/min   GFR calc Af Amer >60 >60 mL/min    Comment: (NOTE) The eGFR has been calculated using the CKD EPI equation. This calculation has not been validated in all clinical situations. eGFR's persistently <60 mL/min signify possible Chronic Kidney Disease.    Anion gap 9 5 - 15    Comment: Performed at [facility], [place] [address], [place], [place] [address]  Magnesium     Status: None   Collection Time: 03/26/18  5:36 AM  Result Value Ref Range   Magnesium 2.3 1.7 - 2.4 mg/dL    Comment: Performed at [facility], [place] [address] [place], [place] [address]  Sedimentation rate     Status: Abnormal   Collection Time: 03/26/18  5:36 AM  Result Value Ref Range   Sed Rate 65 (H) 0 - 16 mm/hr    Comment: Performed at [facility], [place] [address] [place], [place] [address]  Brain natriuretic peptide     Status: Abnormal   Collection Time: 03/26/18  5:36 AM  Result Value Ref Range   B Natriuretic Peptide 374.1 (H) 0.0 - 100.0 pg/mL    Comment: Performed at [facility], [place] [address], [place],  [address]  CBC     Status: Abnormal   Collection Time: 03/26/18  5:36 AM  Result Value Ref Range   WBC 13.8 (H) 4.0 - 10.5 K/uL   RBC 4.21 (L) 4.22 - 5.81 MIL/uL   Hemoglobin 13.5 13.0 - 17.0 g/dL   HCT 41.0 39.0 - 52.0 %   MCV 97.4 78.0 - 100.0 fL   MCH 32.1 26.0 - 34.0 pg   MCHC 32.9 30.0 - 36.0 g/dL   RDW 14.9 11.5 - 15.5 %   Platelets 291 150 - 400 K/uL    Comment: Performed at [facility], [place] [address] [place], [place] [address]  Heparin level (unfractionated)     Status: None   Collection  Time: 03/26/18  5:36 AM  Result Value Ref Range   Heparin Unfractionated 0.35 0.30 - 0.70 IU/mL    Comment: (NOTE) If heparin results are below expected values, and patient dosage has  been confirmed, suggest follow up testing of  antithrombin III levels. Performed at [facility], [place] [address] [place], [place] [address]   Glucose, capillary     Status: Abnormal   Collection Time: 03/26/18  7:43 AM  Result Value Ref Range   Glucose-Capillary 177 (H) 70 - 99 mg/dL  Glucose, capillary     Status: Abnormal   Collection Time: 03/26/18 12:14 PM  Result Value Ref Range   Glucose-Capillary 149 (H) 70 - 99 mg/dL  Glucose, capillary     Status: Abnormal   Collection Time: 03/26/18  5:06 PM  Result Value Ref Range   Glucose-Capillary 173 (H) 70 - 99 mg/dL  Glucose, capillary     Status: Abnormal   Collection Time: 03/26/18  9:36 PM  Result Value Ref Range   Glucose-Capillary 164 (H) 70 - 99 mg/dL   Comment 1 Notify RN    Comment 2 Document in Chart   CBC     Status: Abnormal   Collection Time: 03/27/18  3:16 AM  Result Value Ref Range   WBC 13.1 (H) 4.0 - 10.5 K/uL   RBC 4.26 4.22 - 5.81 MIL/uL   Hemoglobin 13.3 13.0 - 17.0 g/dL   HCT 41.6 39.0 - 52.0 %   MCV 97.7 78.0 - 100.0 fL   MCH 31.2 26.0 - 34.0 pg   MCHC 32.0 30.0 - 36.0 g/dL   RDW 14.8 11.5 - 15.5 %   Platelets 290 150 - 400 K/uL    Comment: Performed at [facility], [place] [address] [place], [place] [address]  Basic metabolic panel     Status: Abnormal   Collection Time: 03/27/18  3:16 AM  Result Value Ref Range   Sodium 137 135 - 145 mmol/L   Potassium 4.9 3.5 - 5.1 mmol/L   Chloride 98 98 - 111 mmol/L    Comment: Please note change in reference range.   CO2 31 22 - 32 mmol/L   Glucose, Bld 163 (H) 70 - 99 mg/dL    Comment: Please note change in reference range.   BUN 37 (H) 8 - 23 mg/dL    Comment: Please note change in reference range.   Creatinine, Ser 1.05 0.61 - 1.24  mg/dL   Calcium 8.6 (L) 8.9 - 10.3 mg/dL   GFR calc non Af Amer >60 >60 mL/min   GFR calc Af Amer >60 >60 mL/min    Comment: (NOTE) The eGFR has been calculated using the CKD EPI equation. This calculation has not been validated in all clinical situations. eGFR's persistently <60 mL/min signify possible Chronic Kidney Disease.    Anion gap 8 5 - 15    Comment: Performed at [facility], [place] [address] [place], [place] [address]  Magnesium     Status: None   Collection Time: 03/27/18  3:16 AM  Result Value Ref Range   Magnesium 2.4 1.7 - 2.4 mg/dL    Comment: Performed at [facility], [place] [address], [place], [place] [address]  Phosphorus     Status: None   Collection Time: 03/27/18  3:16 AM  Result Value Ref Range   Phosphorus 4.4 2.5 - 4.6 mg/dL    Comment: Performed at [facility], [place] [address], [place], [place] [address]  Blood gas, arterial     Status: Abnormal   Collection Time: 03/27/18  3:32 AM  Result Value Ref Range   O2 Content 15.0 L/min   Delivery systems NON-REBREATHER OXYGEN MASK    pH,  Arterial 7.415 7.350 - 7.450   pCO2 arterial 45.3 32.0 - 48.0 mmHg   pO2, Arterial 85.0 83.0 - 108.0 mmHg   Bicarbonate 28.8 (H) 20.0 - 28.0 mmol/L   Acid-Base Excess 3.8 (H) 0.0 - 2.0 mmol/L   O2 Saturation 96.0 %   Patient temperature 97.5    Collection site RIGHT RADIAL    Drawn by [id number]    Sample type ARTERIAL    Allens test (pass/fail) PASS PASS    Comment: Performed at [facility], [place] [address] [place], [place] [address]  Glucose, capillary     Status: Abnormal   Collection Time: 03/27/18  7:30 AM  Result Value Ref Range   Glucose-Capillary 166 (H) 70 - 99 mg/dL    ECG   N/A  Telemetry   A-fib with CVR - Personally Reviewed  Radiology    Dg Chest Port 1 View  Result Date: 03/27/2018 CLINICAL DATA:  History of ET tube EXAM: PORTABLE CHEST 1 VIEW COMPARISON:  Chest radiograph  03/26/2018 FINDINGS: Monitoring leads overlie the patient. Stable cardiac and mediastinal contours. Interval worsening of diffuse bilateral airspace opacities. Small bilateral pleural effusions. No pneumothorax. IMPRESSION: Interval worsening of diffuse bilateral consolidative opacities raising the possibility of multifocal pneumonia. Electronically Signed   By: Lovey Newcomer M.D.   On: 03/27/2018 07:53   Dg Chest Port 1 View  Result Date: 03/26/2018 CLINICAL DATA:  Atrial fibrillation. Shortness of breath. Respiratory failure. EXAM: PORTABLE CHEST 1 VIEW COMPARISON:  Chest CTA dated 03/25/2018. FINDINGS: Normal sized heart. Extensive patchy opacity in both lungs. No visible pleural fluid. No acute bony abnormality. IMPRESSION: Extensive patchy opacity in both lungs with an appearance most compatible with multilobar pneumonia. Electronically Signed   By: Claudie Revering M.D.   On: 03/26/2018 07:21    Cardiac Studies   LV EF: 40% -   45%  ------------------------------------------------------------------- Indications:      Atrial fibrillation - 427.31.  ------------------------------------------------------------------- History:   PMH:  dysrhythmia.  ------------------------------------------------------------------- Study Conclusions  - Left ventricle: The cavity size was normal. Wall thickness was   normal. Systolic function was mildly to moderately reduced. The   estimated ejection fraction was in the range of 40% to 45%.   Moderate hypokinesis of the mid-apicalanteroseptal myocardium. - Mitral valve: There was mild regurgitation. - Left atrium: The atrium was mildly dilated. - Right ventricle: The cavity size was mildly dilated. Systolic   function was mildly reduced. - Right atrium: The atrium was mildly dilated.  Assessment   Principal Problem:   Acute on chronic respiratory failure (HCC) Active Problems:   Acute and chronic respiratory failure (acute-on-chronic) (HCC)    Atrial fibrillation with RVR (HCC)   Acute systolic (congestive) heart failure (HCC)   Plan   Rate control improved overnight - will consolidate diltiazem dosing starting tomorrow morning. Change metoprolol to Toprol XL for CHF benefit starting today. Continue IV lasix today and Eliquis for anticoagulation.  Time Spent Directly with Patient:  I have spent a total of 25 minutes with the patient reviewing hospital notes, telemetry, EKGs, labs and examining the patient as well as establishing an assessment and plan that was discussed personally with the patient.  > 50% of time was spent in direct patient care.  Length of Stay:  LOS: 3 days   Pixie Casino, MD, [facility], [place] Director of the Advanced Lipid Disorders &  Cardiovascular Risk Reduction Clinic Diplomate of the American Board  of Clinical Lipidology Attending Cardiologist  Direct Dial: [phone]  Fax: [phone]  Website:  [email or web address] 03/27/2018, 10:39 AM

## 2018-03-28 LAB — BLOOD GAS, ARTERIAL
ACID-BASE EXCESS: 6.4 mmol/L — AB (ref 0.0–2.0)
Bicarbonate: 31.2 mmol/L — ABNORMAL HIGH (ref 20.0–28.0)
DRAWN BY: 232811
FIO2: 100
O2 SAT: 85.3 %
Patient temperature: 97.9
pCO2 arterial: 45.7 mmHg (ref 32.0–48.0)
pH, Arterial: 7.446 (ref 7.350–7.450)
pO2, Arterial: 51.7 mmHg — ABNORMAL LOW (ref 83.0–108.0)

## 2018-03-28 LAB — PHOSPHORUS: PHOSPHORUS: 3.8 mg/dL (ref 2.5–4.6)

## 2018-03-28 LAB — CBC
HCT: 44.6 % (ref 39.0–52.0)
HEMOGLOBIN: 14.8 g/dL (ref 13.0–17.0)
MCH: 32.2 pg (ref 26.0–34.0)
MCHC: 33.2 g/dL (ref 30.0–36.0)
MCV: 97 fL (ref 78.0–100.0)
Platelets: 284 10*3/uL (ref 150–400)
RBC: 4.6 MIL/uL (ref 4.22–5.81)
RDW: 14.7 % (ref 11.5–15.5)
WBC: 15.4 10*3/uL — AB (ref 4.0–10.5)

## 2018-03-28 LAB — BASIC METABOLIC PANEL
ANION GAP: 13 (ref 5–15)
BUN: 43 mg/dL — ABNORMAL HIGH (ref 8–23)
CHLORIDE: 92 mmol/L — AB (ref 98–111)
CO2: 30 mmol/L (ref 22–32)
CREATININE: 1 mg/dL (ref 0.61–1.24)
Calcium: 8.5 mg/dL — ABNORMAL LOW (ref 8.9–10.3)
GFR calc non Af Amer: >60 mL/min (ref >60)
Glucose, Bld: 187 mg/dL — ABNORMAL HIGH (ref 70–99)
Potassium: 4 mmol/L (ref 3.5–5.1)
SODIUM: 135 mmol/L (ref 135–145)

## 2018-03-28 LAB — GLUCOSE, CAPILLARY
GLUCOSE-CAPILLARY: 162 mg/dL — AB (ref 70–99)
GLUCOSE-CAPILLARY: 178 mg/dL — AB (ref 70–99)
GLUCOSE-CAPILLARY: 168 mg/dL — AB (ref 70–99)
GLUCOSE-CAPILLARY: 214 mg/dL — AB (ref 70–99)

## 2018-03-28 LAB — MAGNESIUM: MAGNESIUM: 2.4 mg/dL (ref 1.7–2.4)

## 2018-03-28 MED ORDER — METOPROLOL TARTRATE 5 MG/5ML IV SOLN
5.0000 mg | Freq: Four times a day (QID) | INTRAVENOUS | Status: DC
  Administered 2018-03-28 – 2018-03-30 (×8): 5 mg via INTRAVENOUS
  Filled 2018-03-28 (×8): qty 5

## 2018-03-28 MED ORDER — FUROSEMIDE 10 MG/ML IJ SOLN
40.0000 mg | Freq: Three times a day (TID) | INTRAMUSCULAR | Status: AC
  Administered 2018-03-28 (×2): 40 mg via INTRAVENOUS
  Filled 2018-03-28 (×2): qty 4

## 2018-03-28 MED ORDER — HALOPERIDOL 1 MG PO TABS: 5.0000 mg | ORAL_TABLET | Freq: Once | ORAL | Status: DC

## 2018-03-28 MED ORDER — FUROSEMIDE 10 MG/ML IJ SOLN
40.0000 mg | Freq: Once | INTRAMUSCULAR | Status: AC
  Administered 2018-03-28: 40 mg via INTRAVENOUS
  Filled 2018-03-28: qty 4

## 2018-03-28 MED ORDER — HALOPERIDOL LACTATE 5 MG/ML IJ SOLN
5.0000 mg | Freq: Once | INTRAMUSCULAR | Status: DC
  Filled 2018-03-28: qty 1

## 2018-03-28 MED ORDER — LORAZEPAM 2 MG/ML IJ SOLN
0.5000 mg | Freq: Three times a day (TID) | INTRAMUSCULAR | Status: DC | PRN
  Administered 2018-03-29: 1 mg via INTRAVENOUS
  Filled 2018-03-28: qty 1

## 2018-03-28 MED ORDER — METHYLPREDNISOLONE SODIUM SUCC 125 MG IJ SOLR
80.0000 mg | Freq: Two times a day (BID) | INTRAMUSCULAR | Status: DC
  Administered 2018-03-28 – 2018-03-30 (×4): 80 mg via INTRAVENOUS
  Filled 2018-03-28 (×4): qty 2

## 2018-03-28 MED ORDER — FAMOTIDINE IN NACL 20-0.9 MG/50ML-% IV SOLN
20.0000 mg | Freq: Two times a day (BID) | INTRAVENOUS | Status: DC
  Administered 2018-03-28 – 2018-03-29 (×4): 20 mg via INTRAVENOUS
  Filled 2018-03-28 (×4): qty 50

## 2018-03-28 MED ORDER — DILTIAZEM HCL-DEXTROSE 100-5 MG/100ML-% IV SOLN (PREMIX)
5.0000 mg/h | INTRAVENOUS | Status: DC
  Administered 2018-03-28: 15 mg/h via INTRAVENOUS
  Administered 2018-03-28: 5 mg/h via INTRAVENOUS
  Administered 2018-03-29 – 2018-03-30 (×5): 15 mg/h via INTRAVENOUS
  Filled 2018-03-28 (×7): qty 100

## 2018-03-28 MED ORDER — DILTIAZEM LOAD VIA INFUSION
15.0000 mg | Freq: Once | INTRAVENOUS | Status: AC
  Administered 2018-03-28: 15 mg via INTRAVENOUS
  Filled 2018-03-28: qty 15

## 2018-03-28 NOTE — Progress Notes (Addendum)
Progress Note  Patient Name: Omar Mitchell Date of Encounter: 03/28/2018  Primary Cardiologist: Kristeen Miss, MD  Subjective   HR gradually escalated overnight as his respiratory status worsened - hypoxic even despite HFNC, now on bipap and resting comfortably. HR remains 110-130s. Metoprolol and diltiazem changed to IV while on bipap. No CP.  Inpatient Medications    Scheduled Meds: . apixaban  5 mg Oral BID  . fluticasone  1 spray Each Nare Daily  . furosemide  40 mg Intravenous Q8H  . haloperidol lactate  5 mg Intramuscular Once  . insulin aspart  0-15 Units Subcutaneous TID WC  . insulin aspart  0-5 Units Subcutaneous QHS  . mouth rinse  15 mL Mouth Rinse BID  . methylPREDNISolone (SOLU-MEDROL) injection  80 mg Intravenous Q12H  . metoprolol tartrate  5 mg Intravenous Q6H  . sodium chloride  1 spray Each Nare BID   Continuous Infusions: . sodium chloride Stopped (03/26/18 1330)  . diltiazem (CARDIZEM) infusion 5 mg/hr (03/28/18 1117)  . famotidine (PEPCID) IV 20 mg (03/28/18 1156)   PRN Meds: sodium chloride, ALPRAZolam, guaiFENesin-dextromethorphan, ipratropium, levalbuterol   Vital Signs    Vitals:   03/28/18 0730 03/28/18 0800 03/28/18 0900 03/28/18 1139  BP:  111/73 120/88   Pulse:  (!) 114 92 93  Resp:  (!) 23 (!) 27   Temp: (!) 96.9 F (36.1 C)     TempSrc: Axillary     SpO2:  94% 95%   Weight:      Height:        Intake/Output Summary (Last 24 hours) at 03/28/2018 1158 Last data filed at 03/28/2018 0600 Gross per 24 hour  Intake 240 ml  Output 2370 ml  Net -2130 ml   Filed Weights   03/26/18 0434 03/27/18 0400 03/28/18 0345  Weight: 219 lb 2.2 oz (99.4 kg) 219 lb 5.7 oz (99.5 kg) 217 lb 6 oz (98.6 kg)    Telemetry    Atrial fib rates gradually escalated overnight as above - Personally Reviewed  Physical Exam   GEN: Ill appearing WM, pale, in NAD on bipap HEENT: Normocephalic, atraumatic, sclera non-icteric. Neck: No JVD or  bruits. Cardiac: Rapid, irregular, no murmurs, rubs, or gallops.  Radials/DP/PT 1+ and equal bilaterally.  Respiratory: Crackles at bases, bipap in place, Breathing is unlabored but assisted by bipap GI: Soft, nontender, non-distended, BS +x 4. MS: no deformity. Extremities: No clubbing or cyanosis. No edema. Distal pedal pulses are 2+ and equal bilaterally. Neuro:  AAOx3. Follows commands. Psych:  Responds to questions appropriately with a normal affect.  Labs    Chemistry Recent Labs  Lab 06-Apr-2018 1713  03/26/18 0536 03/27/18 0316 03/28/18 0324  NA 136   < > 135 137 135  K 4.1   < > 4.6 4.9 4.0  CL 97*   < > 99 98 92*  CO2 28   < > 27 31 30   GLUCOSE 131*   < > 159* 163* 187*  BUN 15   < > 30* 37* 43*  CREATININE 0.84   < > 0.93 1.05 1.00  CALCIUM 8.4*   < > 8.5* 8.6* 8.5*  PROT 7.1  --   --   --   --   ALBUMIN 2.8*  --   --   --   --   AST 28  --   --   --   --   ALT 22  --   --   --   --  ALKPHOS 90  --   --   --   --   BILITOT 1.2  --   --   --   --   GFRNONAA >60   < > >60 >60 >60  GFRAA >60   < > >60 >60 >60  ANIONGAP 11   < > 9 8 13    < > = values in this interval not displayed.     Hematology Recent Labs  Lab 03/26/18 0536 03/27/18 0316 03/28/18 0324  WBC 13.8* 13.1* 15.4*  RBC 4.21* 4.26 4.60  HGB 13.5 13.3 14.8  HCT 41.0 41.6 44.6  MCV 97.4 97.7 97.0  MCH 32.1 31.2 32.2  MCHC 32.9 32.0 33.2  RDW 14.9 14.8 14.7  PLT 291 290 284    Cardiac Enzymes Recent Labs  Lab 03/14/2018 1713 03/07/2018 2220 03/25/18 0501  TROPONINI <0.03 <0.03 <0.03   No results for input(s): TROPIPOC in the last 168 hours.   BNP Recent Labs  Lab 03/08/2018 1713 03/26/18 0536  BNP 445.4* 374.1*     DDimer No results for input(s): DDIMER in the last 168 hours.   Radiology    Dg Chest Port 1 View  Result Date: 03/27/2018 CLINICAL DATA:  History of ET tube EXAM: PORTABLE CHEST 1 VIEW COMPARISON:  Chest radiograph 03/26/2018 FINDINGS: Monitoring leads overlie the  patient. Stable cardiac and mediastinal contours. Interval worsening of diffuse bilateral airspace opacities. Small bilateral pleural effusions. No pneumothorax. IMPRESSION: Interval worsening of diffuse bilateral consolidative opacities raising the possibility of multifocal pneumonia. Electronically Signed   By: Annia Beltrew  Davis M.D.   On: 03/27/2018 07:53    Patient Profile     82 y.o. male with interstitial lung disease/idiopathic pulmonary fibrosis, chronic respiratory failure, anxiety who presented with worsening dyspnea and hypoxia, found also to have new LV dysfunction EF 40-45% with WMA and atrial fibrillation. Admission notable for lactic acidosis on arrival, IPF flare with worsening oxygenation despite diuresis and treatment with steroids, as well as intermittent confusion. Conversation 7/1 per PCCM and patient/wife that prognosis is felt to be poor.  Assessment & Plan    1. Acute on chronic respiratory failure in the setting of IPF flare - PCCM note reviewed. Now on Bipap. CXR yesterday raising ? Of PNA - will defer to PCCM.  2. Acute systolic CHF with new LV dysfunction, also with mildly reduced RV function - CT chest showed calcific coronary artery and aortic atherosclerosis. Although with LV dysfunction, it does not seem he would be a good candidate for invasive ischemic evaluation given his severe pulmonary disease. Troponins were negative on admission so no evidence of ACS this admission. PASP normal by echo. Does not appear significantly volume overloaded by my eye but diuresing quite a bit on Lasix. BUN rising. Will review diuretic regimen with Dr. Anne FuSkains. Hold off ACEI/ARB/spiro to allow BP room for AVN blocking agents.  3. Atrial fibrillation with RVR - it appears Eliquis was held per nurse notes due to patient being NPO while on bipap but Dr. Ulyses JarredMcQuaid's note indicates to continue. I've written an order to the nurse to please clarify if still able to give Eliquis -> if not, consider IV  heparin while NPO. Diltiazem 15mg  given about an hour ago, now on drip actively being titrated. IV Lopressor is also due - follow HR with these measures. Some of his HR increase may be due to not getting oral meds as he was on bipap from this morning on. Long term diltiazem is less ideal  given LV dysfunction but may be necessary to achieve rate control while critically ill. I'm not convinced digoxin would make a big difference in his HR but can also be considered. Add TSH in AM.  4. Leukocytosis - in setting of steroid rx.  For questions or updates, please contact [facility] Please consult [email or web address] for contact info under Cardiology/STEMI.  Signed, Laurann Montana, PA-C 03/28/2018, 11:58 AM    Personally seen and examined. Agree with above.  Currently on Bipap, sleepy. Wife at bedside, niece just arrived.  Positive shortness of breath, less agitated than this morning. Since starting back on IV diltiazem, heart rate under better control, currently in the 90s.  Telemetry personally reviewed.  GEN:Ill appearing in resp distress  HEENT: bipap  Neck: no JVD, carotid bruits, or masses Cardiac: irreg normal rate; no murmurs, rubs, or gallops,no edema  Respiratory:  clear to auscultation bilaterally, normal work of breathing GI: soft, nontender, nondistended, + BS MS: no deformity or atrophy  Skin: warm and dry, no rash Neuro: Sleeping Psych: Sleeping  Assessment and plan  Acute on chronic respiratory failure, IPF flare in this setting of reduced ejection fraction as well with atrial fibrillation RVR, leukocytosis - Rate control improved markedly with IV diltiazem.  He also has metoprolol for back-up as well.  Agree that digoxin would not likely be helpful.  Avoiding amiodarone given underlying lung disease.  Continuing with current supportive care.  His BUN is increasing.  I agree with 2 further doses of IV Lasix.  As long as he is not having significant increase in creatinine, makes sense  to continue.  Large AA gradient.  Family is aware of poor prognosis.  Critical care time 35 minutes spent with patient, data review, lab review, telemetry review this gentleman with acute respiratory failure, acute cardiac arrhythmia.  Donato Schultz, MD

## 2018-03-28 NOTE — Progress Notes (Signed)
eLink Physician-Brief Progress Note Patient Name: Omar Mitchell DOB: 08/29/1936 MRN: [id number]   Date of Service  03/28/2018  HPI/Events of Note  Hypoxemia - ABG on 30 L/min HFNC = 7.44/45.7/51.7/31.2. HFNC increased to 40 L/min by RT and Sat now = 88% and RR = 31.   eICU Interventions  Will order: 1. Lasix 40 mg IV X 1 now.      Intervention Category Major Interventions: Hypoxemia - evaluation and management  Paras Kreider Eugene 03/28/2018, 2:23 AM

## 2018-03-28 NOTE — Progress Notes (Signed)
Pt initially setup on HHFNC at 30L/min at 100% fio2 for O2 saturations of 84-85%.  Abg results showed Po2 of 51.7.  Flow increased to 40L, 100% fio2, with spo2 88% now.  Elink MD aware and ok with spo2 >=88%.  No further respiratory orders received at this time.

## 2018-03-28 NOTE — Progress Notes (Signed)
PULMONARY / CRITICAL CARE MEDICINE   Name: Omar Mitchell Oblinger MRN: [id number] DOB: 12/07/1935    ADMISSION DATE:  02/22/18 CONSULTATION DATE:  02/22/18  REFERRING MD:  Marchelle Gearingamaswamy  CHIEF COMPLAINT:  Dyspnea  HISTORY OF PRESENT ILLNESS:   82 y/o male with IPF who was admitted with acute on chronic respiratory failure with hypoxemia, new onset CHF and Afib.     SUBJECTIVE:  Worsening respiratory distress and hypoxemia overnight, transitioned to BIPAP, now resting comfortably  VITAL SIGNS: BP 111/73   Pulse (!) 114   Temp (!) 96.9 F (36.1 C) (Axillary)   Resp (!) 23   Ht 6\' 1"  (1.854 m)   Wt 217 lb 6 oz (98.6 kg)   SpO2 94%   BMI 28.68 kg/m   HEMODYNAMICS:    VENTILATOR SETTINGS: FiO2 (%):  [100 %] 100 %  INTAKE / OUTPUT: I/O last 3 completed shifts: In: 600 [P.O.:600] Out: 2945 [Urine:2945]  PHYSICAL EXAMINATION:  General:  Resting comfortably in bed on BIPAP HENT: NCAT OP clear PULM: Crackles bases, normal effort CV: Irreg irreg, no mgr GI: BS+, soft, nontender MSK: normal bulk and tone Neuro: asleep on BIPAP   LABS:  BMET Recent Labs  Lab 03/26/18 0536 03/27/18 0316 03/28/18 0324  NA 135 137 135  K 4.6 4.9 4.0  CL 99 98 92*  CO2 27 31 30   BUN 30* 37* 43*  CREATININE 0.93 1.05 1.00  GLUCOSE 159* 163* 187*    Electrolytes Recent Labs  Lab 03/25/18 0501 03/26/18 0536 03/27/18 0316 03/28/18 0324  CALCIUM 8.5* 8.5* 8.6* 8.5*  MG 2.1 2.3 2.4 2.4  PHOS 3.1  --  4.4 3.8    CBC Recent Labs  Lab 03/26/18 0536 03/27/18 0316 03/28/18 0324  WBC 13.8* 13.1* 15.4*  HGB 13.5 13.3 14.8  HCT 41.0 41.6 44.6  PLT 291 290 284    Coag's No results for input(s): APTT, INR in the last 168 hours.  Sepsis Markers Recent Labs  Lab 06/01/2018 1713 06/01/2018 1950  LATICACIDVEN 2.6* 2.4*  PROCALCITON <0.10  --     ABG Recent Labs  Lab 06/01/2018 1615 03/27/18 0332 03/28/18 0159  PHART 7.498* 7.415 7.446  PCO2ART 33.5 45.3 45.7  PO2ART 67.8*  85.0 51.7*    Liver Enzymes Recent Labs  Lab 06/01/2018 1713  AST 28  ALT 22  ALKPHOS 90  BILITOT 1.2  ALBUMIN 2.8*    Cardiac Enzymes Recent Labs  Lab 06/01/2018 1713 06/01/2018 2220 03/25/18 0501  TROPONINI <0.03 <0.03 <0.03    Glucose Recent Labs  Lab 03/26/18 2136 03/27/18 0730 03/27/18 1213 03/27/18 1651 03/27/18 2121 03/28/18 0735  GLUCAP 164* 166* 153* 164* 152* 162*    Imaging No results found.   STUDIES:  Serology 01/26/18 >> HP panel, RNP, aldolase, ANCA, Scl 70, SSA/SSB, RF, anti CCP, ANA, anti DS dna, ACE all negative PFT 02/01/18 >> FEV1 1.88 (59%), FEV1% 85, DLCO 40% CT angio chest 03/25/18 >> mild coronary atherosclerosis, enlarged PA, peripheral and basilar predominant fibrosis and honeycombing with areas of GGO Echo 6/28 >>   CULTURES: Blood 6/27 >>  Respiratory viral panel 6/27 >> negative  ANTIBIOTICS:  SIGNIFICANT EVENTS: 6/27 Admit from pulmonary office, start steroids 6/28 start heparin gtt  LINES/TUBES: CXR from 6/30 personally reviewed showing bilateral airspace disease, low lung volumes    DISCUSSION: 82 y/o male with IPF here with acute on chronic respiratory failure with hypoxemia in setting of new systolic heart failure and afib.  He Has an IPF  flare as well and oxygenation is worsening despite diuresis.  Prognosis poor.    ASSESSMENT / PLAN:  PULMONARY A: Acute on chronic respiratory failure with hypoxemia > worsening IPF Acute pulmonary edema P:   Continue lasix Adjust solumedrol to 80mg  IV q12h Continue BIPAP Can restart Ofev if able to take by mouth Continue atrovent prn  CARDIOVASCULAR A:  Afib with RVR Newly diagnosed systolic heart failure> doesn't appear volume overloaded on physical exam P:  Tele Change dilt back to IV form while on BIPAP Change metoprolol back to IV while on BIPAP Continue Eliquis  RENAL A:   No acute issues P:   Monitor BMET and UOP Replace electrolytes as  needed  GASTROINTESTINAL A:   GERD P:   Change famotidine to IV while on BIPAP  HEMATOLOGIC A:   No acute issues P:  Monitor for bleeding Transfuse PRBC for Hgb < 7 gm/dL   INFECTIOUS A:   No acute issues P:   Monitor for fever  ENDOCRINE A:   Hyperglycemia on steroids P:   Decrease solumedrol Continue SSI  NEUROLOGIC A:   Intermittent confusion> ICU delirium P:   Minimize medications that contribute to confusion Frequent orientation Lights on during daytime, off at night   FAMILY  - Updates: wife updated bedside on 7/1.  I advised that his situtation was critical and that I am not confident he will survive.  Further, I informed her that invasive mechanical ventilation and CPR would cause more harm than good.  She voiced understanding and agreed his code status should be DNR.  Meeting held with nurses present.  My cc time 35 minutes  Heber Bowers, MD [place] [facility] Pager: [phone] Cell: [phone] After 3pm or if no response, call [phone]    03/28/2018, 9:07 AM ,m

## 2018-03-28 NOTE — Progress Notes (Signed)
eLink Physician-Brief Progress Note Patient Name: Omar MccallumDonald Mitchell DOB: 10/29/1935 MRN: [id number]   Date of Service  03/28/2018  HPI/Events of Note  Hypoxia - Sat = 84% in spite of HFNC and diuresis.   eICU Interventions  Will try BiPAP 100%/IPAP 15/EPAP 5.      Intervention Category Major Interventions: Hypoxemia - evaluation and management  Sommer,Steven Eugene 03/28/2018, 4:56 AM

## 2018-03-28 NOTE — Progress Notes (Signed)
[facility] PCCM  Met with patient's family including both sons.  I advised that he will likely not survive this but we will continue supportive care with solumedrol and diuretics and BIPAP. Will reassess respiratory status in AM.  Roselie Awkward, MD [facility] Pager: [phone] Cell: [phone] After 3pm or if no response, call [phone]

## 2018-03-28 NOTE — Progress Notes (Signed)
Clarified with Dr. Kendrick FriesMcQuaid, holding PO eliquis today due to bipap. Family meeting planned this afternoon.

## 2018-03-28 DEATH — deceased

## 2018-03-29 ENCOUNTER — Inpatient Hospital Stay (HOSPITAL_COMMUNITY): Payer: Medicare Other

## 2018-03-29 LAB — BLOOD GAS, ARTERIAL
ACID-BASE EXCESS: 10.5 mmol/L — AB (ref 0.0–2.0)
Bicarbonate: 35.1 mmol/L — ABNORMAL HIGH (ref 20.0–28.0)
DELIVERY SYSTEMS: POSITIVE
Drawn by: 42246
Expiratory PAP: 5
FIO2: 100
INSPIRATORY PAP: 10
MODE: POSITIVE
O2 Saturation: 95.8 %
PO2 ART: 77.9 mmHg — AB (ref 83.0–108.0)
Patient temperature: 98.6
pCO2 arterial: 45.9 mmHg (ref 32.0–48.0)
pH, Arterial: 7.496 — ABNORMAL HIGH (ref 7.350–7.450)

## 2018-03-29 LAB — BASIC METABOLIC PANEL
ANION GAP: 9 (ref 5–15)
BUN: 48 mg/dL — ABNORMAL HIGH (ref 8–23)
CALCIUM: 8.7 mg/dL — AB (ref 8.9–10.3)
CO2: 38 mmol/L — ABNORMAL HIGH (ref 22–32)
Chloride: 97 mmol/L — ABNORMAL LOW (ref 98–111)
Creatinine, Ser: 0.86 mg/dL (ref 0.61–1.24)
GLUCOSE: 174 mg/dL — AB (ref 70–99)
POTASSIUM: 3.7 mmol/L (ref 3.5–5.1)
SODIUM: 144 mmol/L (ref 135–145)

## 2018-03-29 LAB — CULTURE, BLOOD (ROUTINE X 2)
Culture: NO GROWTH
Culture: NO GROWTH
SPECIAL REQUESTS: ADEQUATE
SPECIAL REQUESTS: ADEQUATE

## 2018-03-29 LAB — GLUCOSE, CAPILLARY
GLUCOSE-CAPILLARY: 206 mg/dL — AB (ref 70–99)
Glucose-Capillary: 179 mg/dL — ABNORMAL HIGH (ref 70–99)
Glucose-Capillary: 184 mg/dL — ABNORMAL HIGH (ref 70–99)
Glucose-Capillary: 171 mg/dL — ABNORMAL HIGH (ref 70–99)

## 2018-03-29 LAB — TSH: TSH: 0.192 u[IU]/mL — AB (ref 0.350–4.500)

## 2018-03-29 LAB — BRAIN NATRIURETIC PEPTIDE: B NATRIURETIC PEPTIDE 5: 719.8 pg/mL — AB (ref 0.0–100.0)

## 2018-03-29 MED ORDER — MORPHINE SULFATE 10 MG/5ML PO SOLN
2.5000 mg | ORAL | Status: DC | PRN
Start: 2018-03-29 — End: 2018-03-29
  Administered 2018-03-29 (×2): 2.5 mg via ORAL
  Filled 2018-03-29 (×2): qty 5

## 2018-03-29 MED ORDER — MORPHINE SULFATE 10 MG/5ML PO SOLN
2.5000 mg | ORAL | Status: DC | PRN
  Administered 2018-03-29 – 2018-03-30 (×2): 2.5 mg via ORAL
  Filled 2018-03-29 (×2): qty 5

## 2018-03-29 MED ORDER — APIXABAN 5 MG PO TABS
5.0000 mg | ORAL_TABLET | Freq: Two times a day (BID) | ORAL | Status: DC
  Administered 2018-03-29 (×2): 5 mg via ORAL
  Filled 2018-03-29 (×2): qty 1

## 2018-03-29 NOTE — Progress Notes (Addendum)
Progress Note  Patient Name: Omar MccallumDonald Mitchell Date of Encounter: 03/29/2018  Primary Cardiologist: Kristeen MissPhilip Nahser, MD   Subjective   SOB but mildly improved. No CP  Inpatient Medications    Scheduled Meds: . apixaban  5 mg Oral BID  . fluticasone  1 spray Each Nare Daily  . haloperidol lactate  5 mg Intramuscular Once  . insulin aspart  0-15 Units Subcutaneous TID WC  . insulin aspart  0-5 Units Subcutaneous QHS  . mouth rinse  15 mL Mouth Rinse BID  . methylPREDNISolone (SOLU-MEDROL) injection  80 mg Intravenous Q12H  . metoprolol tartrate  5 mg Intravenous Q6H  . sodium chloride  1 spray Each Nare BID   Continuous Infusions: . sodium chloride Stopped (03/26/18 1330)  . diltiazem (CARDIZEM) infusion 15 mg/hr (03/29/18 1216)  . famotidine (PEPCID) IV Stopped (03/29/18 1102)   PRN Meds: sodium chloride, ALPRAZolam, guaiFENesin-dextromethorphan, ipratropium, levalbuterol, morphine   Vital Signs    Vitals:   03/29/18 0800 03/29/18 0816 03/29/18 1212 03/29/18 1215  BP: 110/89   139/81  Pulse:  (!) 114 67 (!) 53  Resp: (!) 21 (!) 34 (!) 32 (!) 41  Temp: 98.8 F (37.1 C)  98.8 F (37.1 C) 98.6 F (37 C)  TempSrc:      SpO2: 100% 99% 92% (!) 88%  Weight:      Height:        Intake/Output Summary (Last 24 hours) at 03/29/2018 1256 Last data filed at 03/29/2018 0800 Gross per 24 hour  Intake 679.67 ml  Output 2450 ml  Net -1770.33 ml   Filed Weights   03/27/18 0400 03/28/18 0345 03/29/18 0500  Weight: 219 lb 5.7 oz (99.5 kg) 217 lb 6 oz (98.6 kg) 212 lb 15.4 oz (96.6 kg)    Telemetry    AFIB 100-130 - Personally Reviewed  ECG    No new - Personally Reviewed  Physical Exam   GEN: Ill in bed, FM O2 Neck: No JVD Cardiac: IRRR, tachy, no murmurs, rubs, or gallops.  Respiratory: Clear to auscultation bilaterally. GI: Soft, nontender, non-distended  MS: No edema; No deformity. Neuro:  Nonfocal  Psych: Normal affect   Labs    Chemistry Recent Labs  Lab  29-Mar-2018 1713  03/27/18 0316 03/28/18 0324 03/29/18 0252  NA 136   < > 137 135 144  K 4.1   < > 4.9 4.0 3.7  CL 97*   < > 98 92* 97*  CO2 28   < > 31 30 38*  GLUCOSE 131*   < > 163* 187* 174*  BUN 15   < > 37* 43* 48*  CREATININE 0.84   < > 1.05 1.00 0.86  CALCIUM 8.4*   < > 8.6* 8.5* 8.7*  PROT 7.1  --   --   --   --   ALBUMIN 2.8*  --   --   --   --   AST 28  --   --   --   --   ALT 22  --   --   --   --   ALKPHOS 90  --   --   --   --   BILITOT 1.2  --   --   --   --   GFRNONAA >60   < > >60 >60 >60  GFRAA >60   < > >60 >60 >60  ANIONGAP 11   < > 8 13 9    < > = values in  this interval not displayed.     Hematology Recent Labs  Lab 03/26/18 0536 03/27/18 0316 03/28/18 0324  WBC 13.8* 13.1* 15.4*  RBC 4.21* 4.26 4.60  HGB 13.5 13.3 14.8  HCT 41.0 41.6 44.6  MCV 97.4 97.7 97.0  MCH 32.1 31.2 32.2  MCHC 32.9 32.0 33.2  RDW 14.9 14.8 14.7  PLT 291 290 284    Cardiac Enzymes Recent Labs  Lab 04/09/18 1713 2018/04/09 2220 03/25/18 0501  TROPONINI <0.03 <0.03 <0.03   No results for input(s): TROPIPOC in the last 168 hours.   BNP Recent Labs  Lab 2018-04-09 1713 03/26/18 0536 03/29/18 0252  BNP 445.4* 374.1* 719.8*     DDimer No results for input(s): DDIMER in the last 168 hours.   Radiology    Dg Chest 1 View  Result Date: 03/29/2018 CLINICAL DATA:  Dyspnea. EXAM: CHEST  1 VIEW COMPARISON:  Radiographs of March 27, 2018. FINDINGS: Stable cardiomediastinal silhouette. Stable bilateral diffuse lung opacities are noted concerning for pneumonia or edema. No pneumothorax or pleural effusion is noted. Bony thorax is unremarkable. IMPRESSION: Stable bilateral lung opacities are noted concerning for pneumonia or edema. Electronically Signed   By: Lupita Raider, M.D.   On: 03/29/2018 07:15    Cardiac Studies   EF 45%  Patient Profile     82 y.o. male with IPF, AFIB, systolic HF  Assessment & Plan    AFIB persistent  - continue diltiazem IV for now.  100-130  - IV metoprolol also available.   - Try to convert both to PO when able.   - On eliquis  Acute systolic HF  - EF 45%  - 6.7 L out. OK to hold off further dosing. Appears euvolemic. BUN increased  IPF   - pulm plan reviewed  Poor prognosis.       For questions or updates, please contact [facility] Please consult [email or web address] for contact info under Cardiology/STEMI.      Signed, Donato Schultz, MD  03/29/2018, 12:56 PM

## 2018-03-29 NOTE — Progress Notes (Signed)
Central telemetry notified this RN of a run of A fib RVR, six beats with return to A fib in the 130s. Checked on patient at that time.

## 2018-03-29 NOTE — Progress Notes (Signed)
Patient became very aggitated around 0400.  Was very difficult to consol.  Blood gas was fair and does not be the direct reason for the new onset of confusion.  Patient has settled down and is sleeping.

## 2018-03-29 NOTE — Progress Notes (Signed)
PULMONARY / CRITICAL CARE MEDICINE   Name: Omar Mitchell MRN: [id number] DOB: 1936/01/27    ADMISSION DATE:  04/14/2018 CONSULTATION DATE:  2018-04-14  REFERRING MD:  Marchelle Gearing  CHIEF COMPLAINT:  Dyspnea  HISTORY OF PRESENT ILLNESS:   82 y/o male with IPF who was admitted with acute on chronic respiratory failure with hypoxemia, new onset CHF and Afib.     SUBJECTIVE:  RT reports pt remained on BiPAP overnight, increased work of breathing. Tmax 99.  I/O neg 1.6L in last 24 hours / 6.7 L since admit.  Family at bedside - reports he rested 50/50 overnight.  Felt better after eating.  RN reports pt became more agitated with ativan.    VITAL SIGNS: BP 131/73   Pulse (!) 114   Temp 99 F (37.2 C)   Resp (!) 34   Ht 6\' 1"  (1.854 m)   Wt 212 lb 15.4 oz (96.6 kg)   SpO2 99%   BMI 28.10 kg/m   HEMODYNAMICS:    VENTILATOR SETTINGS: FiO2 (%):  [50 %-100 %] 90 %  INTAKE / OUTPUT: I/O last 3 completed shifts: In: 1008.3 [P.O.:600; I.V.:258.3; IV Piggyback:150] Out: 4000 [Urine:4000]  PHYSICAL EXAMINATION: General: frail elderly male in NAD, lying in bed on bipap  HEENT: MM pink/moist, BiPAP mask in place Neuro: Awake, alert, speech clear, MAE CV: s1s2 rrr, no m/r/g PULM: mild accessory muscle use, ? Pleural friction rub on R, clear anterior, crackles at bases  GI: soft, non-tender, bsx4 active  Extremities: warm/dry, no edema  Skin: no rashes or lesions  LABS:  BMET Recent Labs  Lab 03/27/18 0316 03/28/18 0324 03/29/18 0252  NA 137 135 144  K 4.9 4.0 3.7  CL 98 92* 97*  CO2 31 30 38*  BUN 37* 43* 48*  CREATININE 1.05 1.00 0.86  GLUCOSE 163* 187* 174*    Electrolytes Recent Labs  Lab 03/25/18 0501 03/26/18 0536 03/27/18 0316 03/28/18 0324 03/29/18 0252  CALCIUM 8.5* 8.5* 8.6* 8.5* 8.7*  MG 2.1 2.3 2.4 2.4  --   PHOS 3.1  --  4.4 3.8  --     CBC Recent Labs  Lab 03/26/18 0536 03/27/18 0316 03/28/18 0324  WBC 13.8* 13.1* 15.4*  HGB 13.5 13.3 14.8   HCT 41.0 41.6 44.6  PLT 291 290 284    Coag's No results for input(s): APTT, INR in the last 168 hours.  Sepsis Markers Recent Labs  Lab 04-14-2018 1713 14-Apr-2018 1950  LATICACIDVEN 2.6* 2.4*  PROCALCITON <0.10  --     ABG Recent Labs  Lab 03/27/18 0332 03/28/18 0159 03/29/18 0440  PHART 7.415 7.446 7.496*  PCO2ART 45.3 45.7 45.9  PO2ART 85.0 51.7* 77.9*    Liver Enzymes Recent Labs  Lab 2018/04/14 1713  AST 28  ALT 22  ALKPHOS 90  BILITOT 1.2  ALBUMIN 2.8*    Cardiac Enzymes Recent Labs  Lab April 14, 2018 1713 04-14-2018 2220 03/25/18 0501  TROPONINI <0.03 <0.03 <0.03    Glucose Recent Labs  Lab 03/27/18 2121 03/28/18 0735 03/28/18 1230 03/28/18 1635 03/28/18 2109 03/29/18 0755  GLUCAP 152* 162* 178* 168* 214* 179*    Imaging Dg Chest 1 View  Result Date: 03/29/2018 CLINICAL DATA:  Dyspnea. EXAM: CHEST  1 VIEW COMPARISON:  Radiographs of March 27, 2018. FINDINGS: Stable cardiomediastinal silhouette. Stable bilateral diffuse lung opacities are noted concerning for pneumonia or edema. No pneumothorax or pleural effusion is noted. Bony thorax is unremarkable. IMPRESSION: Stable bilateral lung opacities are noted concerning for  pneumonia or edema. Electronically Signed   By: Lupita RaiderJames  Green Jr, M.D.   On: 03/29/2018 07:15     STUDIES:  Serology 01/26/18 >> HP panel, RNP, aldolase, ANCA, Scl 70, SSA/SSB, RF, anti CCP, ANA, anti DS dna, ACE all negative PFT 02/01/18 >> FEV1 1.88 (59%), FEV1% 85, DLCO 40% CT angio chest 03/25/18 >> mild coronary atherosclerosis, enlarged PA, peripheral and basilar predominant fibrosis and honeycombing with areas of GGO Echo 6/28 >> LVEF 40-45%, moderate hypokinesis of mid-apicalanteroseptal myocardium, mild MR, RA mildly dilated   CULTURES: Blood 6/27 >>  Respiratory viral panel 6/27 >> negative  ANTIBIOTICS:   SIGNIFICANT EVENTS: 6/27 Admit from pulmonary office, start steroids 6/28 start heparin gtt 7/01 DNR, steroids  reduced. Paradoxical effect of ativan.  7/02 Remains on BiPAP  LINES/TUBES:   DISCUSSION: 82 y/o male with IPF here with acute on chronic respiratory failure with hypoxemia in setting of new systolic heart failure and afib.  He Has an IPF flare as well and oxygenation is worsening despite diuresis.  Prognosis poor.    ASSESSMENT / PLAN:  PULMONARY A: Acute on chronic respiratory failure with hypoxemia > worsening IPF Acute pulmonary edema P:   SDU monitoring  O2 as needed to support sats >88% Solumedrol 80mg  IV Q12 BiPAP PRN increased work of breathing.   Atrovent PRN  Resume Ofev once able to take PO's  PRN morphine for dyspnea / increased WOB   CARDIOVASCULAR A:  Afib with RVR Newly diagnosed systolic heart failure> doesn't appear volume overloaded on physical exam P:  Tele monitoring  Continue diltiazem gtt  Lopressor IV while on BiPAP  Work toward transition to off IV meds as able  Continue eliquis   RENAL A:   No acute issues P:   Trend BMP / urinary output Replace electrolytes as indicated Avoid nephrotoxic agents, ensure adequate renal perfusion  GASTROINTESTINAL A:   GERD P:   Pepcid IV while on BiPAP   HEMATOLOGIC A:   No acute issues P:  Trend CBC  Transfuse per ICU guidelines   INFECTIOUS A:   No acute issues P:   Monitor fever curve / wbc trend   ENDOCRINE A:   Hyperglycemia on steroids P:   SSI   NEUROLOGIC A:   Intermittent confusion > ICU delirium, component of steroids  P:   Promote sleep / wake cycle  Lights on during day Frequent orientation  Minimize sedating medications as able    FAMILY  - Updates: Son's & daughter in law updated at bedside 7/2 on NP rounds.  DNR per prior goals of care.  Continue medical care with additional comfort focus.     Canary BrimBrandi Melisia Leming, NP-C [place] [facility] Pgr: [phone] or if no answer [phone] 03/29/2018, 8:38 AM

## 2018-03-29 NOTE — Progress Notes (Addendum)
   03/29/18 1500  Clinical Encounter Type  Visited With Patient and family together  Visit Type Follow-up;Psychological support;Spiritual support  Referral From Chaplain;Nurse  Recommendations Follow up for continued spiritual support and prayer  Advance Directives (For Healthcare)  Does Patient Have a Medical Advance Directive? Yes  Type of Advance Directive Living will  Copy of Living Will in Chart? Yes    Provided support around pt completing living will. Notarized.  Copy placed in chart.  Pt with original and copies.  Provided spiritual support with patient and family around decision making process.  Provided prayers at bedside.  Pt wishes chaplain to return for further spiritual support.    [facility] / [facility] Chaplain Burnis KingfisherMatthew Ermias Tomeo, MDiv, [facility]

## 2018-03-29 NOTE — Progress Notes (Addendum)
eLink Physician-Brief Progress Note Patient Name: Satira MccallumDonald Wescott DOB: 05/07/1936 MRN: [id number]   Date of Service  03/29/2018  HPI/Events of Note  Refusing to wear BiPAP. Family requests another dose of oral Morphine before starting Morphine IV infusion.  Prognosis extremely poor. No response to therapy for IPF.  eICU Interventions  Will order: 1. Increase Morphine dose to Q 2 hour PRN.      Intervention Category Intermediate Interventions: Respiratory distress - evaluation and management  Sommer,Steven Eugene 03/29/2018, 11:42 PM

## 2018-03-29 NOTE — Progress Notes (Signed)
   03/29/18 1100  Clinical Encounter Type  Visited With Family  Visit Type Initial;Psychological support;Spiritual support;Critical Care  Referral From Nurse  Consult/Referral To Chaplain  Spiritual Encounters  Spiritual Needs Emotional;Other (Comment) (Spiritual Care Conversation/Support)  Stress Factors  Patient Stress Factors Not reviewed  Family Stress Factors Health changes;Major life changes;Other (Comment) (Healthcare Decisions )   I visited with the patient and his family per referral from the nurse. The family were asking questions about an Advance Directive, Living Will. I spoke with the patient's wife and son who were present in the room. They patient was asleep during the time of my visit.  Advance Directive paperwork had been provided to the patient. However, the patient had a healthcare decisions conversation [facility]. The doctor's office had a copy of what he had decided. I was given permissions from the family to contact the hospital and get a copy of that document faxed over to our ICU.  I contacted [facility] and they sent over a copy of the document that they had.  The family were in agreement about knowing what the patient's wishes are.   Spiritual Care will follow up with the patient and family at a later time once we get that document from the doctor's office.   Please, contact Spiritual Care for further assistance.   Chaplain Clint BolderBrittany Sloane Palmer M.Div., [facility]

## 2018-03-30 ENCOUNTER — Inpatient Hospital Stay (HOSPITAL_COMMUNITY): Payer: Medicare Other

## 2018-03-30 LAB — CBC
HCT: 45.3 % (ref 39.0–52.0)
Hemoglobin: 14.2 g/dL (ref 13.0–17.0)
MCH: 31.6 pg (ref 26.0–34.0)
MCHC: 31.3 g/dL (ref 30.0–36.0)
MCV: 100.7 fL — AB (ref 78.0–100.0)
PLATELETS: 243 10*3/uL (ref 150–400)
RBC: 4.5 MIL/uL (ref 4.22–5.81)
RDW: 14.7 % (ref 11.5–15.5)
WBC: 19.9 10*3/uL — ABNORMAL HIGH (ref 4.0–10.5)

## 2018-03-30 LAB — BASIC METABOLIC PANEL
ANION GAP: 12 (ref 5–15)
BUN: 53 mg/dL — AB (ref 8–23)
CO2: 35 mmol/L — ABNORMAL HIGH (ref 22–32)
Calcium: 8.5 mg/dL — ABNORMAL LOW (ref 8.9–10.3)
Chloride: 95 mmol/L — ABNORMAL LOW (ref 98–111)
Creatinine, Ser: 0.9 mg/dL (ref 0.61–1.24)
Glucose, Bld: 233 mg/dL — ABNORMAL HIGH (ref 70–99)
POTASSIUM: 4 mmol/L (ref 3.5–5.1)
Sodium: 142 mmol/L (ref 135–145)

## 2018-03-30 MED ORDER — MORPHINE SULFATE (PF) 2 MG/ML IV SOLN: 1.0000 mg | INTRAVENOUS | Status: DC | PRN

## 2018-03-30 MED ORDER — MORPHINE SULFATE 10 MG/5ML PO SOLN: 2.5000 mg | ORAL | Status: DC | PRN

## 2018-03-30 MED ORDER — MORPHINE BOLUS VIA INFUSION
5.0000 mg | INTRAVENOUS | Status: DC | PRN
  Administered 2018-03-30: 20 mg via INTRAVENOUS
  Filled 2018-03-30: qty 20

## 2018-03-30 MED ORDER — MORPHINE 100MG IN NS 100ML (1MG/ML) PREMIX INFUSION
10.0000 mg/h | INTRAVENOUS | Status: DC
  Administered 2018-03-30: 12.5 mg/h via INTRAVENOUS
  Administered 2018-03-30: 10 mg/h via INTRAVENOUS
  Administered 2018-03-30: 12.5 mg/h via INTRAVENOUS
  Administered 2018-03-31 (×2): 10 mg/h via INTRAVENOUS
  Filled 2018-03-30 (×5): qty 100

## 2018-03-30 MED ORDER — MORPHINE SULFATE (PF) 2 MG/ML IV SOLN
2.0000 mg | INTRAVENOUS | Status: DC | PRN
  Administered 2018-03-30 (×3): 2 mg via INTRAVENOUS
  Filled 2018-03-30 (×3): qty 1

## 2018-03-30 MED ORDER — MORPHINE 100MG IN NS 100ML (1MG/ML) PREMIX INFUSION
1.0000 mg/h | INTRAVENOUS | Status: DC
  Filled 2018-03-30: qty 100

## 2018-03-30 NOTE — Progress Notes (Signed)
eLink Physician-Brief Progress Note Patient Name: Omar Mitchell DOB: 02/28/1936 MRN: [id number]   Date of Service  03/30/2018  HPI/Events of Note  Omar Mitchell has had a very rough night. Omar Mitchell has spent most of the night sitting with him and she appreciates how much is suffering. After discussion with her, she now wants to move to comfort care and allow Omar Mitchell to pass with comfort and dignity. She now agrees to a Morphine IV infusion titrated for comfort.   eICU Interventions  Will order comfort care per withdrawal of life-sustaining treatment protocol.      Intervention Category Major Interventions: End of life / care limitation discussion  Omar Mitchell,Omar Mitchell 03/30/2018, 5:47 AM

## 2018-03-30 NOTE — Progress Notes (Signed)
eLink Physician-Brief Progress Note Patient Name: Omar MccallumDonald Staton DOB: 11/20/1935 MRN: [id number]   Date of Service  03/30/2018  HPI/Events of Note  Now nurse states that family doen't want a Morphine IV infusion. Nurse requests IV Morphine.    Will order: 1. D/C Morphine IV infusion.  2. Morphine 1 mg IV Q 1 hour PRN respiratory distress.      Intervention Category Major Interventions: Other:  [name] 03/30/2018, 2:56 AM

## 2018-03-30 NOTE — Progress Notes (Signed)
PULMONARY / CRITICAL CARE MEDICINE   Name: Omar Mitchell MRN: [id number] DOB: 05-18-1936    ADMISSION DATE:  03/22/2018 CONSULTATION DATE:  03/06/2018  REFERRING MD:  Marchelle Gearing  CHIEF COMPLAINT:  Dyspnea  HISTORY OF PRESENT ILLNESS:   82 y/o male with IPF who was admitted with acute on chronic respiratory failure with hypoxemia, new onset CHF and Afib.    SUBJECTIVE:   RN reports pt had a difficult night with agitated delirium mixed with periods of rest.  Morphine gtt initiated per [facility].  Family at bedside.    VITAL SIGNS: BP 119/76   Pulse 63   Temp 98.8 F (37.1 C)   Resp (!) 27   Ht 6\' 1"  (1.854 m)   Wt 212 lb 11.9 oz (96.5 kg)   SpO2 93%   BMI 28.07 kg/m   HEMODYNAMICS:    VENTILATOR SETTINGS:    INTAKE / OUTPUT: I/O last 3 completed shifts: In: 974.5 [P.O.:360; I.V.:514.5; IV Piggyback:100] Out: 2075 [Urine:2075]  PHYSICAL EXAMINATION: General: frail elderly male in NAD lying in bed HEENT: MM pink/dry, 100% NRB in place Neuro: eyes closed, no response to verbal stimuli  CV: s1s2 irr irr, no m/r/g PULM: even/non-labored, lungs bilaterally with basilar crackles  WU:JWJX, non-tender, bsx4 active  Extremities: warm/dry, no edema  Skin: no rashes or lesions  LABS:  BMET Recent Labs  Lab 03/28/18 0324 03/29/18 0252 03/30/18 0318  NA 135 144 142  K 4.0 3.7 4.0  CL 92* 97* 95*  CO2 30 38* 35*  BUN 43* 48* 53*  CREATININE 1.00 0.86 0.90  GLUCOSE 187* 174* 233*    Electrolytes Recent Labs  Lab 03/25/18 0501 03/26/18 0536 03/27/18 0316 03/28/18 0324 03/29/18 0252 03/30/18 0318  CALCIUM 8.5* 8.5* 8.6* 8.5* 8.7* 8.5*  MG 2.1 2.3 2.4 2.4  --   --   PHOS 3.1  --  4.4 3.8  --   --     CBC Recent Labs  Lab 03/27/18 0316 03/28/18 0324 03/30/18 0318  WBC 13.1* 15.4* 19.9*  HGB 13.3 14.8 14.2  HCT 41.6 44.6 45.3  PLT 290 284 243    Coag's No results for input(s): APTT, INR in the last 168 hours.  Sepsis Markers Recent Labs  Lab  03/25/2018 1713 02/27/2018 1950  LATICACIDVEN 2.6* 2.4*  PROCALCITON <0.10  --     ABG Recent Labs  Lab 03/27/18 0332 03/28/18 0159 03/29/18 0440  PHART 7.415 7.446 7.496*  PCO2ART 45.3 45.7 45.9  PO2ART 85.0 51.7* 77.9*    Liver Enzymes Recent Labs  Lab 03/07/2018 1713  AST 28  ALT 22  ALKPHOS 90  BILITOT 1.2  ALBUMIN 2.8*    Cardiac Enzymes Recent Labs  Lab 03/17/2018 1713 03/17/2018 2220 03/25/18 0501  TROPONINI <0.03 <0.03 <0.03    Glucose Recent Labs  Lab 03/28/18 1635 03/28/18 2109 03/29/18 0755 03/29/18 1241 03/29/18 1645 03/29/18 2140  GLUCAP 168* 214* 179* 184* 206* 171*    Imaging Dg Chest Port 1 View  Result Date: 03/30/2018 CLINICAL DATA:  Acute respiratory failure with hypoxia. EXAM: PORTABLE CHEST 1 VIEW COMPARISON:  Radiographs of March 29, 2018. FINDINGS: Stable cardiomediastinal silhouette. Stable bilateral lung opacities are noted concerning for pneumonia or edema. No pneumothorax or pleural effusion is noted. Bony thorax is unremarkable. IMPRESSION: Stable bilateral lung opacities are noted concerning for pneumonia or edema. Electronically Signed   By: Lupita Raider, M.D.   On: 03/30/2018 07:13     STUDIES:  Serology 01/26/18 >> HP  panel, RNP, aldolase, ANCA, Scl 70, SSA/SSB, RF, anti CCP, ANA, anti DS dna, ACE all negative PFT 02/01/18 >> FEV1 1.88 (59%), FEV1% 85, DLCO 40% CT angio chest 03/25/18 >> mild coronary atherosclerosis, enlarged PA, peripheral and basilar predominant fibrosis and honeycombing with areas of GGO Echo 6/28 >> LVEF 40-45%, moderate hypokinesis of mid-apicalanteroseptal myocardium, mild MR, RA mildly dilated   CULTURES: Blood 6/27 >> negative Respiratory viral panel 6/27 >> negative  ANTIBIOTICS:   SIGNIFICANT EVENTS: 6/27 Admit from pulmonary office, start steroids 6/28 start heparin gtt 7/01 DNR, steroids reduced. Paradoxical effect of ativan.  7/02 Remains on BiPAP 7/03 Transitioned to full comfort  care  LINES/TUBES:   DISCUSSION: 82 y/o male with IPF here with acute on chronic respiratory failure with hypoxemia in setting of new systolic heart failure and afib.  He Has an IPF flare as well and oxygenation is worsening despite diuresis.  Prognosis poor.    ASSESSMENT / PLAN:  PULMONARY A: Acute on chronic respiratory failure with hypoxemia > worsening IPF Acute pulmonary edema P:   Morphine gtt for comfort. Titrate for normal respiratory rate.   No further steroids  O2 as needed for comfort   CARDIOVASCULAR A:  Afib with RVR Newly diagnosed systolic heart failure> doesn't appear volume overloaded on physical exam P:  No further tele monitoring / comfort screen  Hold further anticoagulation, rate controlling agents   RENAL A:   No acute issues P:   No further labs  GASTROINTESTINAL A:   GERD P:   No further interventions  Palliative feeding if patient requests    HEMATOLOGIC A:   No acute issues P:  No further labs  ENDOCRINE A:   Hyperglycemia on steroids P:   No further SSI   NEUROLOGIC A:   Intermittent confusion > ICU delirium, component of steroids  P:   Morphine for comfort  Supportive care   FAMILY  - Updates: Wife, sons, daughter-in-law, and grandson updated at bedside 7/3 on NP rounds.    Canary BrimBrandi Zyaire Mccleod, NP-C  Pulmonary & Critical Care Pgr: [phone] or if no answer [phone] 03/30/2018, 8:25 AM

## 2018-03-30 NOTE — Progress Notes (Signed)
Continued support with pt's sons and grandson, caregiver / family friend, and extended family.  Family shares stories of growing up with Dorinda Hillonald as father and grandfather.  Processes experience of waiting for end of life.   Spiritual care will continue to follow for support around end of life.    [facility] / [facility] Chaplain Burnis KingfisherMatthew Kaimana Lurz, MDiv, [facility]

## 2018-03-30 NOTE — Progress Notes (Signed)
CSW Consult-Comfort Care Patient family is at bedside.  CSW will make self avaiable if needed during this time.   Vivi BarrackNicole Jaydn Moscato, Alexander MtLCSW, MSW Clinical Social Worker  [phone] 03/30/2018  1:53 PM

## 2018-03-30 NOTE — Progress Notes (Signed)
eLink Physician-Brief Progress Note Patient Name: Omar MccallumDonald Mitchell DOB: 05/26/1936 MRN: [id number]   Date of Service  03/30/2018  HPI/Events of Note  Wife requests that oral Morphine be advanced to a morphine IV infusion.  She understands that the morphine will not treat his underlying disease and may hasten his decline.   eICU Interventions  Will advance Morphine to IV infusion at 1 mg/hour.      Intervention Category Major Interventions: Hypoxemia - evaluation and management  Sommer,Steven Eugene 03/30/2018, 2:25 AM

## 2018-03-30 NOTE — Progress Notes (Signed)
Notes indicate patient is being transitioned to comfort care.   [facility] will sign off.   Medication Recommendations: N/A Other recommendations (labs, testing, etc):  N/A Follow up as an outpatient: N/A  Please call if we can be of any further assistance.  Dayna Dunn PA-C

## 2018-03-30 NOTE — Progress Notes (Signed)
   03/30/18 1000  Clinical Encounter Type  Visited With Family  Visit Type Follow-up;Psychological support;Spiritual support  Referral From Nurse  Consult/Referral To Chaplain  Spiritual Encounters  Spiritual Needs Emotional;Grief support;Other (Comment) (Spiritual Care Conversation/Support)  Stress Factors  Patient Stress Factors Not reviewed  Family Stress Factors Health changes;Major life changes   I followed up with the family in the hallway per referral. Non needs were present at this time. The patient's son stated that they were doing ok under the circumstances.  Please, contact Spiritual Care for further assistance.   Chaplain Clint BolderBrittany Dacotah Cabello M.Div., [facility]

## 2018-04-01 ENCOUNTER — Telehealth: Payer: Self-pay | Admitting: Internal Medicine

## 2018-04-01 NOTE — Telephone Encounter (Signed)
Saw that pt had passed away. Due to pt's passing, called [facility] to let them know that pt did pass and that all further refills of pt's OFEV could be cancelled.  Nothing further needed.

## 2018-04-08 ENCOUNTER — Telehealth: Payer: Self-pay

## 2018-04-08 NOTE — Telephone Encounter (Signed)
On 04/08/18 I [facility] a [facility] ([facility] The d/c is for burial.  The patient is a patient of Doctor McQuaid.   The d/c will be taken to Pulmonary Unit for signature.  On 04/11/18 I received the d/c back from Doctor McQuaid.  I got the d/c ready and called the funeral home to let them know the d/c was mailed to vital records per the funeral home request.

## 2018-04-11 NOTE — Telephone Encounter (Signed)
04/11/18 received signed D/C back from Dr.Mcquiad. [facility]. John will pick up 04/12/18 PWR

## 2018-04-11 NOTE — Telephone Encounter (Signed)
Update; [employer] will mail out a envelope to this office, to have D/C Mailed to the health department.

## 2018-04-13 ENCOUNTER — Telehealth: Payer: Self-pay | Admitting: Internal Medicine

## 2018-04-13 NOTE — Telephone Encounter (Signed)
Will route message to Dr. Marchelle Gearingamaswamy so that he may call Dr. Melvenia BeamSimon.

## 2018-04-14 NOTE — Telephone Encounter (Signed)
[facility]Simon [facility] safety team Giave death dx of   IPF flare Acute systolic chf nos A Fib at admit  Dr. Kalman ShanMurali Nikea Settle, M.D., [facility].C.P Pulmonary and Critical Care Medicine Staff Physician, [facility] Director - Interstitial Lung Disease  Program  Pulmonary Fibrosis [facility] [place], [place], [id number]  Pager: [phone], If no answer or between  15:00h - 7:00h: call [phone] Telephone: [phone]

## 2018-04-28 NOTE — Progress Notes (Signed)
RT placed the Pt on a 50% VM per Dr. Kendrick FriesMcquaid.

## 2018-04-28 NOTE — Death Summary Note (Signed)
DEATH SUMMARY   Patient Details  Name: Omar Mitchell MRN: [id number] DOB: 1936/04/08  Admission/Discharge Information   Admit Date:  04/22/18  Date of Death: Date of Death: 04/29/2018  Time of Death: Time of Death: 1018  Length of Stay: 7  Referring Physician: Ignatius Specking, MD   Reason(s) for Hospitalization  dyspnea  Diagnoses  Preliminary cause of death:   IPF Secondary Diagnoses (including complications and co-morbidities):  Principal Problem:   Acute on chronic respiratory failure (HCC) Active Problems:   Acute and chronic respiratory failure (acute-on-chronic) (HCC)   Atrial fibrillation with RVR (HCC)   Acute systolic (congestive) heart failure [facility])   Brief Hospital Course (including significant findings, care, treatment, and services provided and events leading to death)  [name] is a 82 y.o. year old male who was recently diagnosed with idiopathic pulmonary fibrosis and started on a medication called Ofev.  He presented to our hospital with a complaint of worsening shortness of breath and hypoxemia.  He was admitted and underwent culture testing, IV steroids, and IV diuretics.  Chest imaging showed worsening bilateral airspace disease.  Considering his lack of improvement and no clear cause of an underlying infectious etiology he was diagnosed with an idiopathic pulmonary fibrosis flare.  He was treated for several days in the ICU with high oxygen requirements and noninvasive mechanical ventilation as well as high-dose steroids and diuretics.  His condition continued to worsen despite this.  We had frank conversations with the patient and his family regarding the severity of this condition and his overall poor prognosis.  They elected to change his CODE STATUS to DNR and changed to full comfort measures.  He died peacefully with family at the bedside.    Pertinent Labs and Studies  Significant Diagnostic Studies Dg Chest 1 View  Result Date: 03/29/2018 CLINICAL  DATA:  Dyspnea. EXAM: CHEST  1 VIEW COMPARISON:  Radiographs of March 27, 2018. FINDINGS: Stable cardiomediastinal silhouette. Stable bilateral diffuse lung opacities are noted concerning for pneumonia or edema. No pneumothorax or pleural effusion is noted. Bony thorax is unremarkable. IMPRESSION: Stable bilateral lung opacities are noted concerning for pneumonia or edema. Electronically Signed   By: Lupita Raider, M.D.   On: 03/29/2018 07:15   Ct Angio Chest Pe W Or Wo Contrast  Result Date: 03/25/2018 CLINICAL DATA:  82 y/o M; persistent dry cough worse at night and shortness of breath for 4-6 months. Leukocytosis. History of pulmonary fibrosis. EXAM: CT ANGIOGRAPHY CHEST WITH CONTRAST TECHNIQUE: Multidetector CT imaging of the chest was performed using the standard protocol during bolus administration of intravenous contrast. Multiplanar CT image reconstructions and MIPs were obtained to evaluate the vascular anatomy. CONTRAST:  ISOVUE-370 IOPAMIDOL (ISOVUE-370) INJECTION 76% COMPARISON:  07/05/2017 CT chest. Concurrent 03/25/2018 high-resolution CT of the chest. FINDINGS: Cardiovascular: Mild coronary artery calcific atherosclerosis. Mitral annular calcification. Normal caliber thoracic aorta with mild calcific atherosclerosis. Enlarged main pulmonary artery to 3.7 cm. Satisfactory opacification of the pulmonary arteries. Respiratory motion artifact in the lung bases. No pulmonary embolus identified. Mediastinum/Nodes: Normal thyroid gland. Normal thoracic esophagus. Mild mediastinal, subcarinal, and bilateral hilar lymphadenopathy, likely reactive. Lungs/Pleura: Pulmonary fibrosis with a peripheral and basilar predominance and honeycombing similar to the 2018 CT of the chest. Interval development of diffuse ground-glass opacities of the lungs with focal areas of lobular sparing. Upper Abdomen: Cholelithiasis. 14 mm left adrenal nodule measuring -27 HU with focal fat, consistent with myelolipoma  Musculoskeletal: No chest wall abnormality. No acute or significant  osseous findings. Review of the MIP images confirms the above findings. IMPRESSION: 1. Respiratory motion artifact in the lung bases. No pulmonary embolus identified. 2. Pulmonary fibrosis with peripheral and basilar predominance and honeycombing. Please refer to concurrent high-resolution CT of the chest for further characterization. 3. Diffuse ground-glass opacities with lobular areas of sparing, possibly superimposed pulmonary edema or pneumonitis. 4. Cholelithiasis. 5. 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 6. Enlarged main pulmonary artery compatible pulmonary artery hypertension. 7. Mild aortic and coronary artery calcific atherosclerosis. Electronically Signed   By: Mitzi HansenLance  Furusawa-Stratton M.D.   On: 03/25/2018 03:35   Ct Chest High Resolution  Result Date: 03/26/2018 CLINICAL DATA:  Persistent dry cough, worse at night. Shortness of breath for the past 4-6 months. EXAM: CT CHEST WITHOUT CONTRAST TECHNIQUE: Multidetector CT imaging of the chest was performed following the standard protocol without intravenous contrast. High resolution imaging of the lungs, as well as inspiratory and expiratory imaging, was performed. COMPARISON:  Chest radiographs dated 09/27/2017. Chest CT dated 07/05/2017. Subsequent chest CTA dated 03/25/2018. FINDINGS: Cardiovascular: Atheromatous calcifications, including the coronary arteries and aorta. Mildly enlarged heart. Mildly enlarged central pulmonary arteries. Mediastinum/Nodes: Enlarged mediastinal and bilateral hilar lymph nodes. These are better demonstrated on the subsequent chest CTA. Normal appearing thyroid gland. Lungs/Pleura: Extensive patchy ground-glass opacity in both lungs with peripheral honeycombing. No pleural fluid. Upper Abdomen: 2.4 cm gallstone in the gallbladder. No gallbladder wall thickening or pericholecystic fluid. Musculoskeletal: Thoracic spine degenerative changes. IMPRESSION: 1.  Extensive patchy ground-glass opacities in both lungs compatible with active infection/inflammation. 2. Interstitial fibrosis with extensive peripheral honeycombing in both lungs. 3. Cholelithiasis. 4. Mild cardiomegaly and evidence of pulmonary arterial hypertension. 5.  Calcific coronary artery and aortic atherosclerosis. 6. Mediastinal and bilateral hilar adenopathy, most likely reactive. Aortic Atherosclerosis (ICD10-I70.0). Electronically Signed   By: Beckie SaltsSteven  Reid M.D.   On: 03/26/2018 07:30   Dg Chest Port 1 View  Result Date: 03/30/2018 CLINICAL DATA:  Acute respiratory failure with hypoxia. EXAM: PORTABLE CHEST 1 VIEW COMPARISON:  Radiographs of March 29, 2018. FINDINGS: Stable cardiomediastinal silhouette. Stable bilateral lung opacities are noted concerning for pneumonia or edema. No pneumothorax or pleural effusion is noted. Bony thorax is unremarkable. IMPRESSION: Stable bilateral lung opacities are noted concerning for pneumonia or edema. Electronically Signed   By: Lupita RaiderJames  Green Jr, M.D.   On: 03/30/2018 07:13   Dg Chest Port 1 View  Result Date: 03/27/2018 CLINICAL DATA:  History of ET tube EXAM: PORTABLE CHEST 1 VIEW COMPARISON:  Chest radiograph 03/26/2018 FINDINGS: Monitoring leads overlie the patient. Stable cardiac and mediastinal contours. Interval worsening of diffuse bilateral airspace opacities. Small bilateral pleural effusions. No pneumothorax. IMPRESSION: Interval worsening of diffuse bilateral consolidative opacities raising the possibility of multifocal pneumonia. Electronically Signed   By: Annia Beltrew  Davis M.D.   On: 03/27/2018 07:53   Dg Chest Port 1 View  Result Date: 03/26/2018 CLINICAL DATA:  Atrial fibrillation. Shortness of breath. Respiratory failure. EXAM: PORTABLE CHEST 1 VIEW COMPARISON:  Chest CTA dated 03/25/2018. FINDINGS: Normal sized heart. Extensive patchy opacity in both lungs. No visible pleural fluid. No acute bony abnormality. IMPRESSION: Extensive patchy opacity  in both lungs with an appearance most compatible with multilobar pneumonia. Electronically Signed   By: Beckie SaltsSteven  Reid M.D.   On: 03/26/2018 07:21    Microbiology No results found for this or any previous visit (from the past 240 hour(s)).  Lab Basic Metabolic Panel: Recent Labs  Lab 03/30/18 0318  NA 142  K 4.0  CL 95*  CO2 35*  GLUCOSE 233*  BUN 53*  CREATININE 0.90  CALCIUM 8.5*   Liver Function Tests: No results for input(s): AST, ALT, ALKPHOS, BILITOT, PROT, ALBUMIN in the last 168 hours. No results for input(s): LIPASE, AMYLASE in the last 168 hours. No results for input(s): AMMONIA in the last 168 hours. CBC: Recent Labs  Lab 03/30/18 0318  WBC 19.9*  HGB 14.2  HCT 45.3  MCV 100.7*  PLT 243   Cardiac Enzymes: No results for input(s): CKTOTAL, CKMB, CKMBINDEX, TROPONINI in the last 168 hours. Sepsis Labs: Recent Labs  Lab 03/30/18 0318  WBC 19.9*       Daja Shuping 04/05/2018, 5:22 PM

## 2018-04-28 NOTE — Progress Notes (Signed)
[facility] P[facility]M  S: Continues to rest quietly on morphine infusion  O:  Vitals:   03/30/18 1900 03/30/18 2000 03/30/18 2200 04/02/2018 0000  BP:      Pulse: (!) 57 (!) 36 60 (!) 47  Resp:    (!) 9  Temp: 99.9 F (37.7 C) 100 F (37.8 C) 100.2 F (37.9 C) 100.2 F (37.9 C)  TempSrc:      SpO2: 93% 95% 96% 94%  Weight:      Height:       100% NRB mask  General:  Resting comfortably in bed HENT: NCAT OP clear PULM: Crackles bases B, normal effort CV: RRR, no mgr GI: BS+, soft, nontender MSK: normal bulk and tone Neuro: awake, alert, no distress, MAEW  Impression/Plan:  Acute flare of IPF causing acute on chronic respiratory failure with hypoxemia: continuing to rest quietly; high flow/100% FiO2 likely prolonging the dying process but family desires to maintain this for his comfort.  Will plan to continue morphine as ordered, monitor closely here in ICU.  Will address decreasing FiO2 with family.  Heber CarolinaBrent McQuaid, MD [facility] [facility] Pager: [phone] Cell: [phone] After 3pm or if no response, call [phone]

## 2018-04-28 DEATH — deceased

## 2018-05-11 ENCOUNTER — Telehealth: Payer: Self-pay | Admitting: Internal Medicine

## 2018-05-11 NOTE — Telephone Encounter (Signed)
When looking in the patients chart it looks like Dr. Kendrick FriesMcquaid signed the death certificate.   MR please advise on information that you would like for us to give to the patients daughter in law. Thank you.

## 2018-05-11 NOTE — Telephone Encounter (Signed)
Definitely can call patient daughter in law Clydie BraunKaren but pleae find out who signed death certificate? I do not recall doing death certificate because he died after hospitalization. Please send note back to me with answer

## 2018-05-11 NOTE — Telephone Encounter (Signed)
Called and spoke to pt's daughter in law, Clydie BraunKaren.  Clydie BraunKaren had additional questions regarding death certificate. Clydie BraunKaren stated on death certificate it states that smoking may had contributed to pt's death. Per Clydie BraunKaren pt was a non smoker. Per pt's chart, pt is listed as never smoker. Clydie BraunKaren also states that MR was going to refer pt to a lawyer regarding pt's dx. Clydie BraunKaren stated that a lawyer's office did not reach out to pt regarding this matter. Clydie BraunKaren is requesting an update.   MR please advise. Thanks.

## 2018-05-13 NOTE — Telephone Encounter (Signed)
Attempted to contact Medical Records. I did not receive an answer and I could not leave a message. Will try back.

## 2018-05-13 NOTE — Telephone Encounter (Signed)
1. pe my hx - patient was listed as non-smoker. Sonot sure why death certificate listed that. I am not sure if that can corrected. Please ask Marcelino DusterMichelle who brings death certificates to us or someone in med records as to how this can be corrected . I can do a corrected one. Apologize how this happened   2. Yes I did speak to lawyer - family has to pursue the case with the legal grup. I spoke to Mr Chilton GreathouseMichael Broome Attorney at [facility] [employer]   3. I call KArena nd LMTCB. If she calls back page me   Dr. Kalman ShanMurali Myrle Dues, M.D., [facility].P Pulmonary and Critical Care Medicine Staff Physician, [facility] Center Director - Interstitial Lung Disease  Program  Pulmonary Fibrosis [facility] Network at [facility] [place], [place], [address]  Pager: [phone], If no answer or between  15:00h - 7:00h: call [phone] Telephone: [phone]

## 2018-05-13 NOTE — Telephone Encounter (Signed)
Routing message to MR to please advise 

## 2018-05-16 NOTE — Telephone Encounter (Signed)
Omar Mitchell with [facility] [place] in [place] called back. States she can take a letter from [place] with the following information on it.  Deceased's Name,  Date of Death, [place], and stating please correct/change box #28 to state no for tobacco use.   [facility] at [phone]. Omar Mitchell requests we call her when faxing letter to her attention.   McQuaid please advise. Thanks.

## 2018-05-16 NOTE — Telephone Encounter (Signed)
Spoke with Omar Mitchell at [employer]-states we will have to contact the funeral home to ask about correcting death certificate. [facility]eral [facility] is: [facility] Address: [address], [place], [place] [address] Phone: [phone]  Called funeral home; was told that she(Omar Mitchell) is not sure how to go about correcting death certificate. She suggested to call vital records dept in [place], [place] and ask them how to do that. Phone number: [phone] option 4, then option 5. Was told I needed to speak with [facility] @ [phone]. They will need to see if just the box for smoking hx checked or did they write the dx of smoking hx contributed to death. Also, Omar Mitchell will be required to make changes/update death certificate since he is the one that signed the original.   Omar BoysMolly informed me that we will have to do a "supplement" to the original death certificate. Which means there will be and "attachment" to the death certificate and will "trump" the front page for cause of death.    I was able to obtain copy of death certificate and note that #28(did tobacco use contribute to death?) box was checked as probably. This is the portion that needs to be corrected. I have left message for Omar BoysMolly at [facility] [place] to contact me directly.

## 2018-05-17 ENCOUNTER — Encounter: Payer: Self-pay | Admitting: *Deleted

## 2018-05-17 NOTE — Telephone Encounter (Signed)
OK by me to compose this letter to indicate change in tobacco use documentation on death certificate.

## 2018-05-17 NOTE — Telephone Encounter (Signed)
Letter has been written and signed by BQ. This [facility]. She was made aware before I faxed it. Nothing further was needed.

## 2018-05-19 NOTE — Telephone Encounter (Signed)
Please inform patient daughter in law   Thanks  Dr. Kalman ShanMurali Jakayla Schweppe, M.D., [facility]P Pulmonary and Critical Care Medicine Staff Physician, [facility] Center Director - Interstitial Lung Disease  Program  Pulmonary Fibrosis [facility] at [facility] [place], [place], [address]  Pager: [phone], If no answer or between  15:00h - 7:00h: call [phone] Telephone: [phone]

## 2018-05-25 ENCOUNTER — Telehealth: Payer: Self-pay | Admitting: Internal Medicine

## 2018-05-25 NOTE — Telephone Encounter (Signed)
Called patients daughter in law unable to reach left message to call back.

## 2018-05-26 NOTE — Telephone Encounter (Signed)
Spoke with pt's daughter in law, Clydie BraunKaren. Advised her that took care of this last week. I have given her the number to contact to follow up on the change and new death certificate.

## 2018-09-01 LAB — BLOOD GAS, ARTERIAL
Bicarbonate: 25.8 mmol/L (ref 20.0–28.0)
DRAWN BY: 257881
O2 CONTENT: 3 L/min
O2 Saturation: 94.1 %
PO2 ART: 67.8 mmHg — AB (ref 83.0–108.0)
pCO2 arterial: 33.5 mmHg (ref 32.0–48.0)
pH, Arterial: 7.498 — ABNORMAL HIGH (ref 7.350–7.450)

## 2019-09-10 IMAGING — CT CT CHEST HIGH RESOLUTION W/O CM
2 of 13 series · 10 of 46 positions shown, 17 images · non-contrast
Comparison: Chest radiographs dated 09/27/2017. Chest CT dated
07/05/2017. Subsequent chest CTA dated 03/25/2018.

CLINICAL DATA: Persistent dry cough, worse at night. Shortness of
breath for the past 4-6 months.

EXAM:
CT CHEST WITHOUT CONTRAST
TECHNIQUE: Multidetector CT imaging of the chest was performed following the
standard protocol without intravenous contrast. High resolution
imaging of the lungs, as well as inspiratory and expiratory imaging,
was performed.

[Series 12: thins · axial · 0.75mm/px · z∈[+1332,+1556]mm · 9 of 283 slices shown, 15 images]
[im 29/283  soft-tissue]
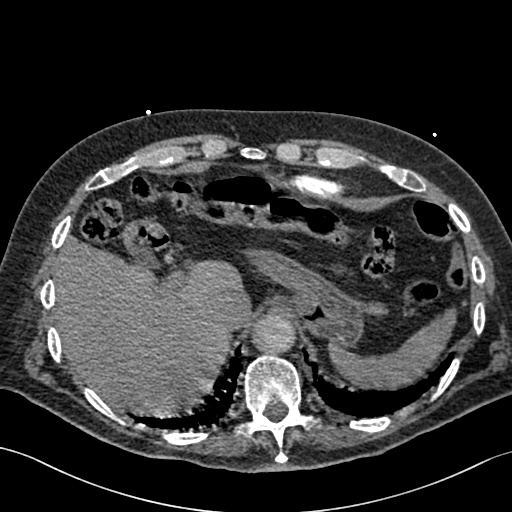
[im 29/283  bone]
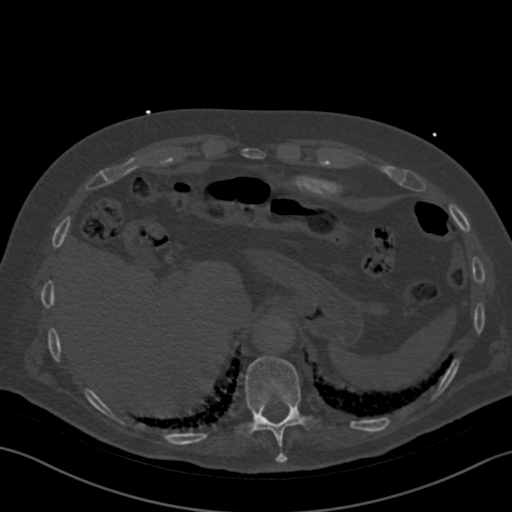
[im 57/283  soft-tissue]
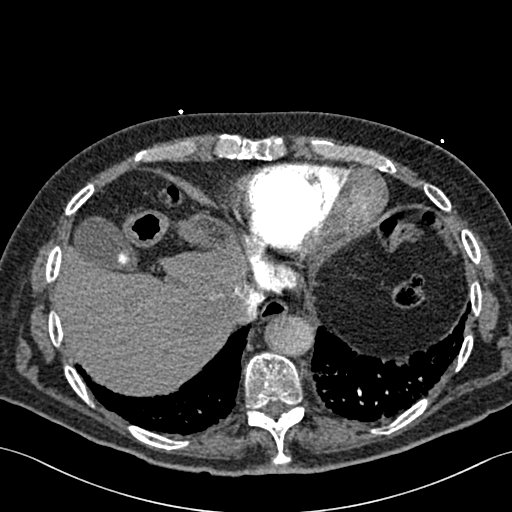
[im 85/283  soft-tissue]
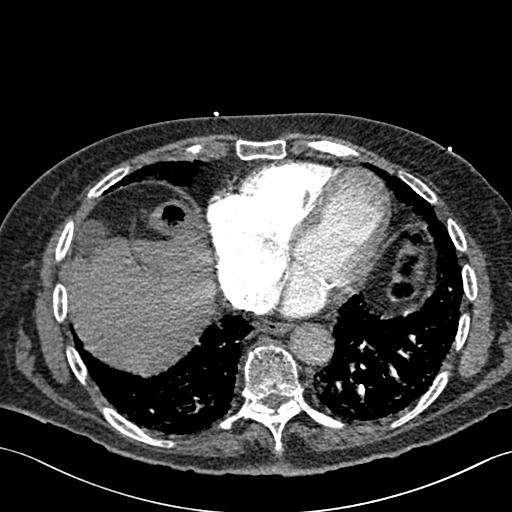
[im 113/283  soft-tissue]
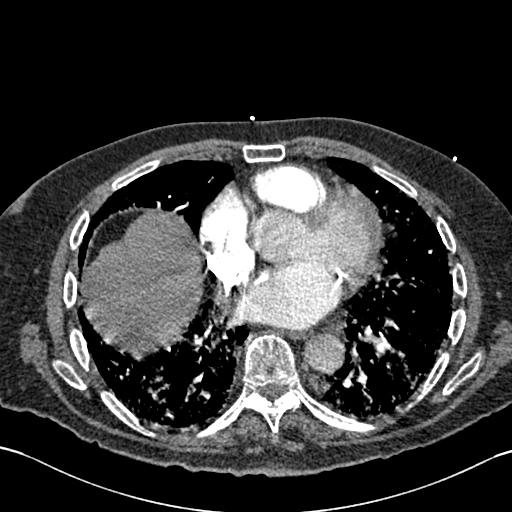
[im 142/283  soft-tissue]
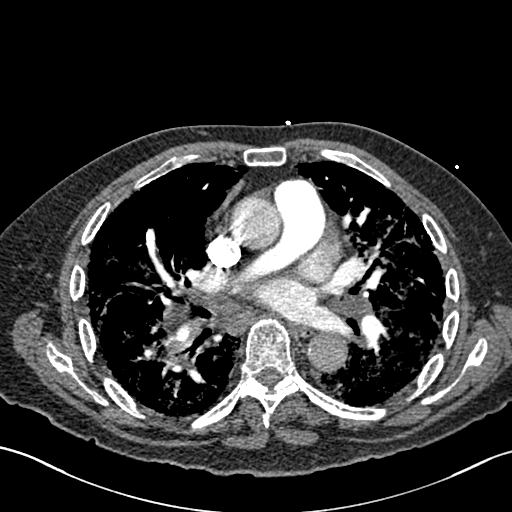
[im 170/283  soft-tissue]
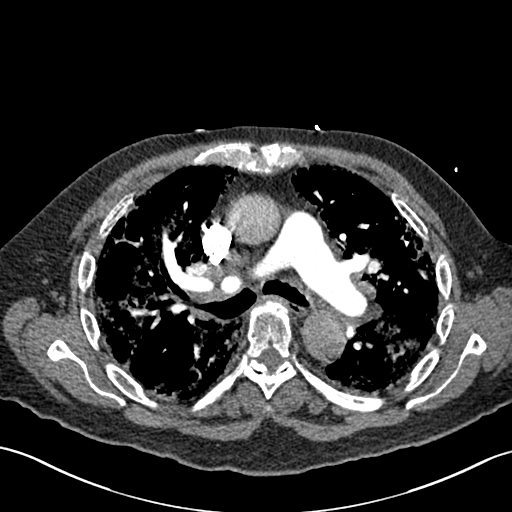
[im 170/283  lung]
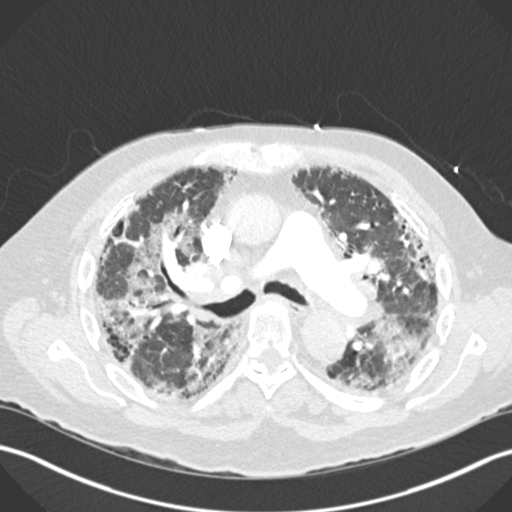
[im 198/283  soft-tissue]
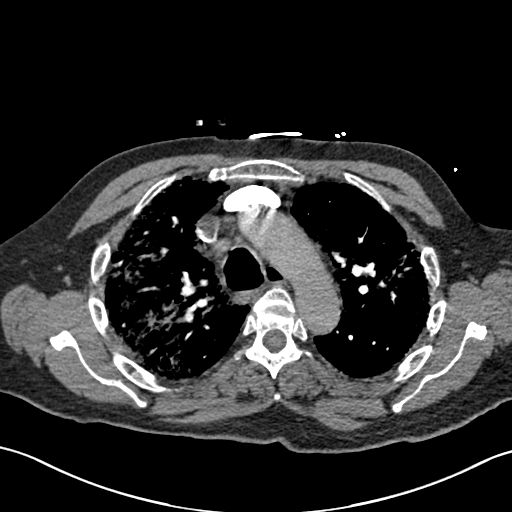
[im 198/283  lung]
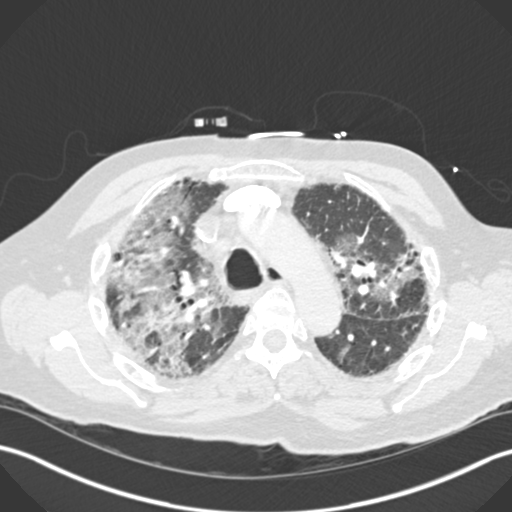
[im 226/283  soft-tissue]
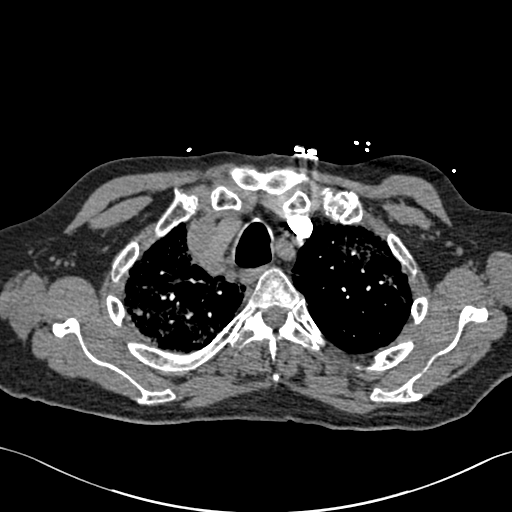
[im 226/283  lung]
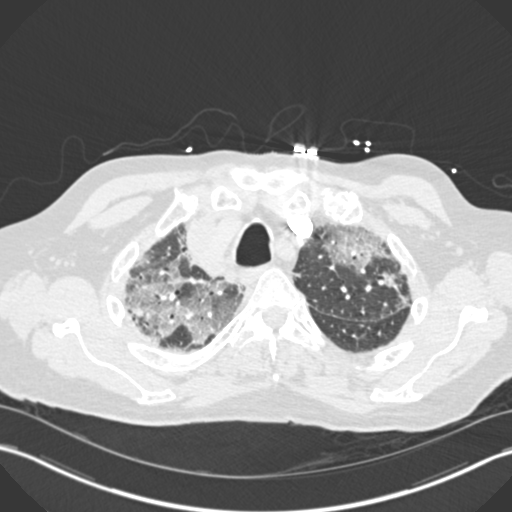
[im 254/283  soft-tissue]
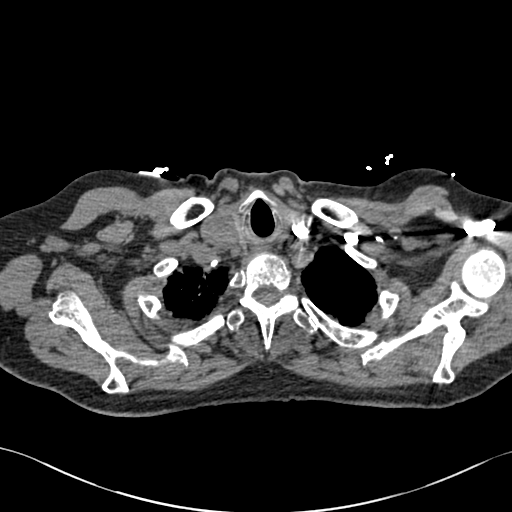
[im 254/283  lung]
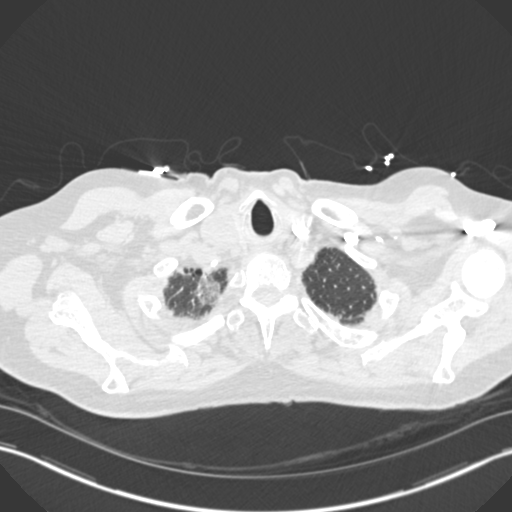
[im 254/283  bone]
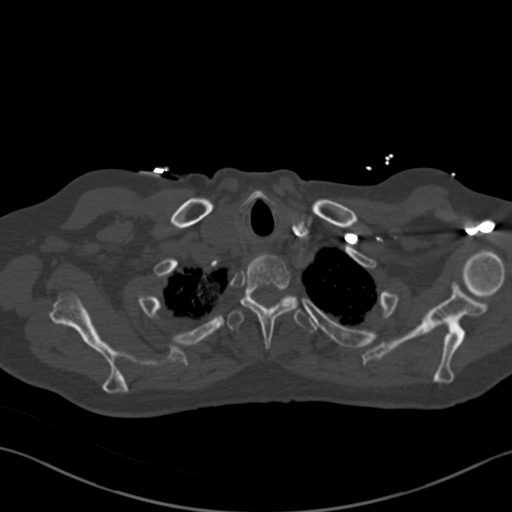

[Series 14: coronal mpr · coronal · 0.57mm/px · 1 of 87 slices shown, 2 images]
[im 44/87  soft-tissue]
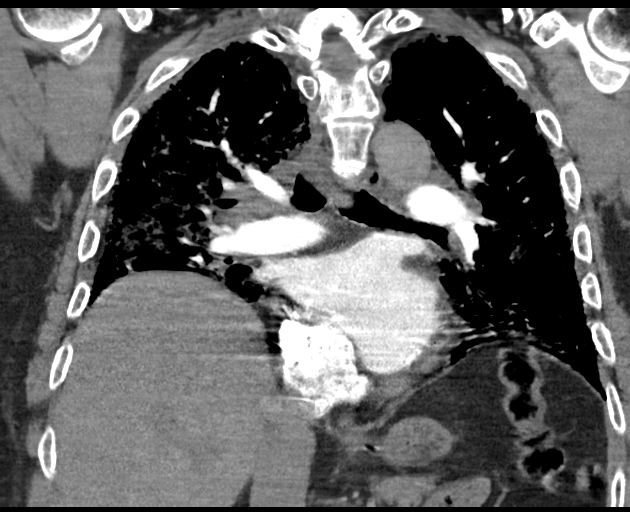
[im 44/87  bone]
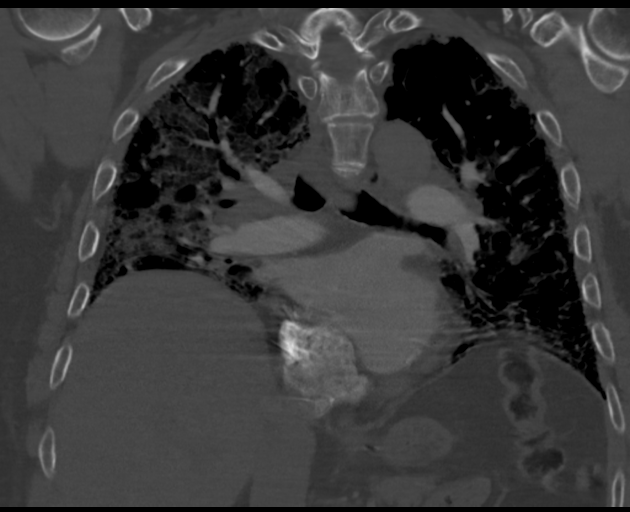

[10 of 46 positions shown; findings below may reference images not displayed]

FINDINGS: Cardiovascular: Atheromatous calcifications, including the coronary
arteries and aorta. Mildly enlarged heart. Mildly enlarged central
pulmonary arteries.

Mediastinum/Nodes: Enlarged mediastinal and bilateral hilar lymph
nodes. These are better demonstrated on the subsequent chest CTA.
Normal appearing thyroid gland.

Lungs/Pleura: Extensive patchy ground-glass opacity in both lungs
with peripheral honeycombing. No pleural fluid.

Upper Abdomen: 2.4 cm gallstone in the gallbladder. No gallbladder
wall thickening or pericholecystic fluid.

Musculoskeletal: Thoracic spine degenerative changes.
IMPRESSION: 1. Extensive patchy ground-glass opacities in both lungs compatible
with active infection/inflammation.
2. Interstitial fibrosis with extensive peripheral honeycombing in
both lungs.
3. Cholelithiasis.
4. Mild cardiomegaly and evidence of pulmonary arterial
hypertension.
5.  Calcific coronary artery and aortic atherosclerosis.
6. Mediastinal and bilateral hilar adenopathy, most likely reactive.

Aortic Atherosclerosis ([id number]-KD1.1).

## 2019-09-12 IMAGING — DX DG CHEST 1V PORT
1 series · 1 of 1 positions shown · non-contrast
Comparison: Chest radiograph 03/26/2018

CLINICAL DATA: History of ET tube

EXAM:
PORTABLE CHEST 1 VIEW

[chest ap]
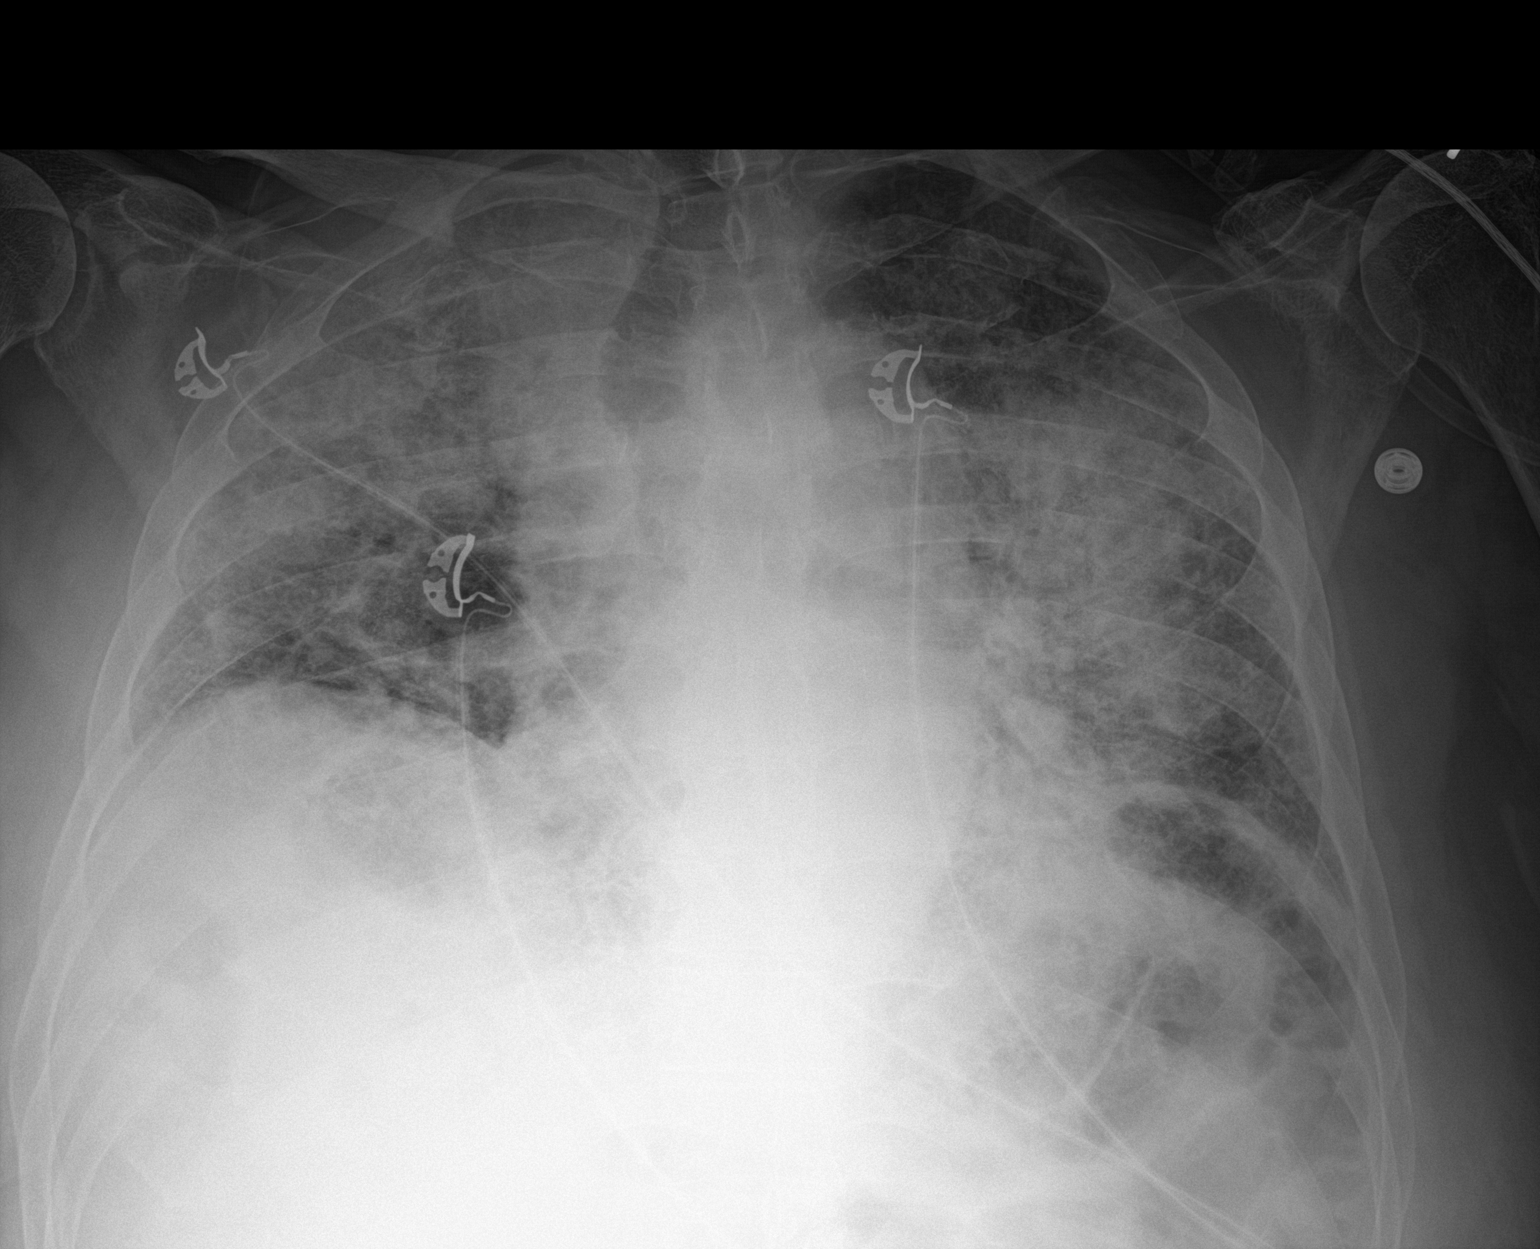

[1 of 1 positions shown; findings below may reference images not displayed]

FINDINGS: Monitoring leads overlie the patient. Stable cardiac and mediastinal
contours. Interval worsening of diffuse bilateral airspace
opacities. Small bilateral pleural effusions. No pneumothorax.
IMPRESSION: Interval worsening of diffuse bilateral consolidative opacities
raising the possibility of multifocal pneumonia.
